# Patient Record
Sex: Male | Born: 1950 | Race: White | Hispanic: No | Marital: Married | Smoking: Former smoker
Health system: Southern US, Community
[De-identification: ages and names within clinical notes are randomized; demographics above are authoritative.]

## PROBLEM LIST (undated history)

## (undated) DIAGNOSIS — E119 Type 2 diabetes mellitus without complications: Secondary | ICD-10-CM

## (undated) DIAGNOSIS — I10 Essential (primary) hypertension: Secondary | ICD-10-CM

## (undated) DIAGNOSIS — G930 Cerebral cysts: Secondary | ICD-10-CM

## (undated) DIAGNOSIS — E785 Hyperlipidemia, unspecified: Secondary | ICD-10-CM

## (undated) HISTORY — DX: Type 2 diabetes mellitus without complications: E11.9

## (undated) HISTORY — DX: Essential (primary) hypertension: I10

## (undated) HISTORY — DX: Cerebral cysts: G93.0

## (undated) HISTORY — DX: Hyperlipidemia, unspecified: E78.5

---

## 1999-09-17 ENCOUNTER — Emergency Department (HOSPITAL_COMMUNITY): Admission: EM | Admit: 1999-09-17 | Discharge: 1999-09-17 | Payer: Self-pay | Admitting: *Deleted

## 2012-12-09 DIAGNOSIS — G930 Cerebral cysts: Secondary | ICD-10-CM

## 2012-12-09 HISTORY — DX: Cerebral cysts: G93.0

## 2012-12-26 ENCOUNTER — Telehealth: Payer: Self-pay

## 2012-12-26 ENCOUNTER — Ambulatory Visit (HOSPITAL_COMMUNITY)
Admission: RE | Admit: 2012-12-26 | Discharge: 2012-12-26 | Disposition: A | Discharge status: Home / Self Care | Payer: BC Managed Care – PPO | Source: Ambulatory Visit | Attending: Emergency Medicine | Admitting: Emergency Medicine

## 2012-12-26 ENCOUNTER — Ambulatory Visit (INDEPENDENT_AMBULATORY_CARE_PROVIDER_SITE_OTHER): Payer: BC Managed Care – PPO | Admitting: Emergency Medicine

## 2012-12-26 ENCOUNTER — Ambulatory Visit
Admission: RE | Admit: 2012-12-26 | Discharge: 2012-12-26 | Disposition: A | Discharge status: Home / Self Care | Payer: BC Managed Care – PPO | Source: Ambulatory Visit | Attending: Emergency Medicine | Admitting: Emergency Medicine

## 2012-12-26 ENCOUNTER — Other Ambulatory Visit: Payer: Self-pay | Admitting: Emergency Medicine

## 2012-12-26 VITALS — BP 170/102 | HR 83 | Temp 97.6°F | Resp 18 | Ht 71.0 in | Wt 229.0 lb

## 2012-12-26 DIAGNOSIS — R51 Headache: Secondary | ICD-10-CM

## 2012-12-26 DIAGNOSIS — I10 Essential (primary) hypertension: Secondary | ICD-10-CM

## 2012-12-26 DIAGNOSIS — G930 Cerebral cysts: Secondary | ICD-10-CM

## 2012-12-26 DIAGNOSIS — R930 Abnormal findings on diagnostic imaging of skull and head, not elsewhere classified: Secondary | ICD-10-CM

## 2012-12-26 DIAGNOSIS — R4182 Altered mental status, unspecified: Secondary | ICD-10-CM

## 2012-12-26 DIAGNOSIS — R42 Dizziness and giddiness: Secondary | ICD-10-CM

## 2012-12-26 LAB — POCT CBC
Granulocyte percent: 84.9 %G — AB (ref 37–80)
HCT, POC: 56.5 % — AB (ref 43.5–53.7)
Hemoglobin: 19.2 g/dL — AB (ref 14.1–18.1)
Lymph, poc: 1 (ref 0.6–3.4)
MCH, POC: 33.2 pg — AB (ref 27–31.2)
MCHC: 34 g/dL (ref 31.8–35.4)
MCV: 97.6 fL — AB (ref 80–97)
MID (cbc): 0.6 (ref 0–0.9)
MPV: 10 fL (ref 0–99.8)
POC Granulocyte: 9.3 — AB (ref 2–6.9)
POC LYMPH PERCENT: 9.4 %L — AB (ref 10–50)
POC MID %: 5.7 %M (ref 0–12)
Platelet Count, POC: 203 10*3/uL (ref 142–424)
RBC: 5.79 M/uL (ref 4.69–6.13)
RDW, POC: 12.8 %
WBC: 11 10*3/uL — AB (ref 4.6–10.2)

## 2012-12-26 LAB — LIPID PANEL
Cholesterol: 242 mg/dL — ABNORMAL HIGH (ref 0–200)
HDL: 47 mg/dL (ref >39)
LDL Cholesterol: 169 mg/dL — ABNORMAL HIGH (ref 0–99)
Total CHOL/HDL Ratio: 5.1 Ratio
Triglycerides: 128 mg/dL (ref <150)
VLDL: 26 mg/dL (ref 0–40)

## 2012-12-26 LAB — COMPREHENSIVE METABOLIC PANEL
ALT: 47 U/L (ref 0–53)
AST: 41 U/L — ABNORMAL HIGH (ref 0–37)
Albumin: 4.5 g/dL (ref 3.5–5.2)
Alkaline Phosphatase: 55 U/L (ref 39–117)
BUN: 9 mg/dL (ref 6–23)
CO2: 24 mEq/L (ref 19–32)
Calcium: 10.3 mg/dL (ref 8.4–10.5)
Chloride: 98 mEq/L (ref 96–112)
Creat: 1 mg/dL (ref 0.50–1.35)
Glucose, Bld: 265 mg/dL — ABNORMAL HIGH (ref 70–99)
Potassium: 4.1 mEq/L (ref 3.5–5.3)
Sodium: 133 mEq/L — ABNORMAL LOW (ref 135–145)
Total Bilirubin: 1 mg/dL (ref 0.3–1.2)
Total Protein: 8.1 g/dL (ref 6.0–8.3)

## 2012-12-26 LAB — POCT SEDIMENTATION RATE: POCT SED RATE: 4 mm/hr (ref 0–22)

## 2012-12-26 MED ORDER — GADOBENATE DIMEGLUMINE 529 MG/ML IV SOLN
20.0000 mL | Freq: Once | INTRAVENOUS | Status: AC | PRN
  Administered 2012-12-26: 20 mL via INTRAVENOUS

## 2012-12-26 MED ORDER — HYDROCODONE-ACETAMINOPHEN 5-325 MG PO TABS: 1.0000 | ORAL_TABLET | Freq: Four times a day (QID) | ORAL | Status: DC | PRN

## 2012-12-26 MED ORDER — LISINOPRIL 20 MG PO TABS: 20.0000 mg | ORAL_TABLET | Freq: Every day | ORAL | Status: DC

## 2012-12-26 NOTE — Telephone Encounter (Signed)
Will send in a few Norco, however he should attempt to avoid taking these and only take them if he must. Taking a controlled medication for headache can mask symptoms or exacerbate the symptoms.

## 2012-12-26 NOTE — Progress Notes (Signed)
[facility] [address] [place] [address] [phone]  Date:  12/26/2012   Name:  Darren Gutierrez   DOB:  28-May-1950   MRN:  [id number]  PCP:  No PCP Per Patient    Chief Complaint: pain on left side of head and Emesis   History of Present Illness:  Darren Gutierrez is a 62 y.o. 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  Has a left temporal headache for the past week.  No history of injury or antecedent illness. History of untreated hypertension.  Reformed 3 pack a day smoker.  Unsteady on his feet at times. No visual symptoms.  Says has left sided weakness at times.  Says he is pain free now but the headache is quite severe when it comes.  His daughter is the translator and says he is very stubborn and not a complainer so the fact that he is here is indication of how severe his complaint is.  No improvement with over the counter medications or other home remedies. Denies other complaint or health concern today.   There are no active problems to display for this patient.   History reviewed. No pertinent past medical history.  History reviewed. No pertinent past surgical history.  History  Substance Use Topics  . Smoking status: Never Smoker   . Smokeless tobacco: Not on file  . Alcohol Use: Yes    History reviewed. No pertinent family history.  No Known Allergies  Medication list has been reviewed and updated.  No current outpatient prescriptions on file prior to visit.   No current facility-administered medications on file prior to visit.    Review of Systems:  As per HPI, otherwise negative.    Physical Examination: Filed Vitals:   12/26/12 1024  BP: 170/102  Pulse: 83  Temp: 97.6 F (36.4 C)  Resp: 18   Filed Vitals:   12/26/12 1024  Height: 5\' 11"  (1.803 m)  Weight: 229 lb (103.874 kg)   Body mass index is 31.95 kg/(m^2). Ideal Body Weight: Weight in (lb) to  have BMI = 25: 178.9  GEN: WDWN, NAD, Non-toxic, A & O x 3 HEENT: Atraumatic, Normocephalic. Neck supple. No masses, No LAD. Ears and Nose: No external deformity. CV: RRR, No M/G/R. No JVD. No thrill. No extra heart sounds. PULM: CTA B, no wheezes, crackles, rhonchi. No retractions. No resp. distress. No accessory muscle use. ABD: S, NT, ND, +BS. No rebound. No HSM. EXTR: No c/c/e NEURO ataxic gait with impaired tandem gait. PRRERLA EOMI CN 2-12 intact.  Fundi benign PSYCH: Normally interactive. Conversant. Not depressed or anxious appearing.  Calm demeanor.    Assessment and Plan:  Ataxia subarachnoid cyst Labs CT Hypertension Lisinopril  Signed,  Phillips Odor, MD   CT reviewed and discussed with Dr Wynetta Emery.  He asked for a MRI and will see the patient this week in his office.

## 2012-12-26 NOTE — Telephone Encounter (Signed)
Faxed in rx for Norco.  Spoke with Alma Downs (daughter) and gave precautions regarding her father taking Norco.   She states she understands

## 2012-12-26 NOTE — Telephone Encounter (Signed)
Spoke with patient's daughter, gave results of MRI (after discussing with Dr Dareen Piano): MRI showed nothing acute, no cancer, stroke, hemorrhage--->positive for long standing cyst, patient will see neurosurgeon this week regarding cyst. In the meantime, patient is in pain and is taking tylenol.  Can he try anything stronger until appt with neuro?

## 2012-12-26 NOTE — Telephone Encounter (Signed)
Pts daughter Mickie Bail would like for someone to call her regarding pts MRI report, he is in pain and would like some pain medication. Best#[phone]

## 2012-12-27 ENCOUNTER — Ambulatory Visit (INDEPENDENT_AMBULATORY_CARE_PROVIDER_SITE_OTHER): Payer: BC Managed Care – PPO | Admitting: Emergency Medicine

## 2012-12-27 VITALS — BP 120/82 | HR 68 | Temp 98.4°F | Resp 16 | Ht 71.5 in | Wt 225.4 lb

## 2012-12-27 DIAGNOSIS — R7309 Other abnormal glucose: Secondary | ICD-10-CM

## 2012-12-27 DIAGNOSIS — E782 Mixed hyperlipidemia: Secondary | ICD-10-CM

## 2012-12-27 DIAGNOSIS — R51 Headache: Secondary | ICD-10-CM

## 2012-12-27 DIAGNOSIS — E119 Type 2 diabetes mellitus without complications: Secondary | ICD-10-CM

## 2012-12-27 LAB — POCT GLYCOSYLATED HEMOGLOBIN (HGB A1C): Hemoglobin A1C: 8.2

## 2012-12-27 MED ORDER — ROSUVASTATIN CALCIUM 20 MG PO TABS: 20.0000 mg | ORAL_TABLET | Freq: Every day | ORAL | Status: DC

## 2012-12-27 MED ORDER — HYDROCODONE-ACETAMINOPHEN 5-325 MG PO TABS: 1.0000 | ORAL_TABLET | ORAL | Status: DC | PRN

## 2012-12-27 MED ORDER — METFORMIN HCL ER 500 MG PO TB24: ORAL_TABLET | ORAL | Status: DC

## 2012-12-27 NOTE — Patient Instructions (Addendum)
Type 2 Diabetes Mellitus, Adult Type 2 diabetes mellitus, often simply referred to as type 2 diabetes, is a long-lasting (chronic) disease. In type 2 diabetes, the pancreas does not make enough insulin (a hormone), the cells are less responsive to the insulin that is made (insulin resistance), or both. Normally, insulin moves sugars from food into the tissue cells. The tissue cells use the sugars for energy. The lack of insulin or the lack of normal response to insulin causes excess sugars to build up in the blood instead of going into the tissue cells. As a result, high blood sugar (hyperglycemia) develops. The effect of high sugar (glucose) levels can cause many complications. Type 2 diabetes was also previously called adult-onset diabetes but it can occur at any age.  RISK FACTORS  A person is predisposed to developing type 2 diabetes if someone in the family has the disease and also has one or more of the following primary risk factors:  Overweight.  An inactive lifestyle.  A history of consistently eating high-calorie foods. Maintaining a normal weight and regular physical activity can reduce the chance of developing type 2 diabetes. SYMPTOMS  A person with type 2 diabetes may not show symptoms initially. The symptoms of type 2 diabetes appear slowly. The symptoms include:  Increased thirst (polydipsia).  Increased urination (polyuria).  Increased urination during the night (nocturia).  Weight loss. This weight loss may be rapid.  Frequent, recurring infections.  Tiredness (fatigue).  Weakness.  Vision changes, such as blurred vision.  Fruity smell to your breath.  Abdominal pain.  Nausea or vomiting.  Cuts or bruises which are slow to heal.  Tingling or numbness in the hands or feet. DIAGNOSIS Type 2 diabetes is frequently not diagnosed until complications of diabetes are present. Type 2 diabetes is diagnosed when symptoms or complications are present and when blood  glucose levels are increased. Your blood glucose level may be checked by one or more of the following blood tests:  A fasting blood glucose test. You will not be allowed to eat for at least 8 hours before a blood sample is taken.  A random blood glucose test. Your blood glucose is checked at any time of the day regardless of when you ate.  A hemoglobin A1c blood glucose test. A hemoglobin A1c test provides information about blood glucose control over the previous 3 months.  An oral glucose tolerance test (OGTT). Your blood glucose is measured after you have not eaten (fasted) for 2 hours and then after you drink a glucose-containing beverage. TREATMENT   You may need to take insulin or diabetes medicine daily to keep blood glucose levels in the desired range.  You will need to match insulin dosing with exercise and healthy food choices. The treatment goal is to maintain the before meal blood sugar (preprandial glucose) level at 70 130 mg/dL. HOME CARE INSTRUCTIONS   Have your hemoglobin A1c level checked twice a year.  Perform daily blood glucose monitoring as directed by your caregiver.  Monitor urine ketones when you are ill and as directed by your caregiver.  Take your diabetes medicine or insulin as directed by your caregiver to maintain your blood glucose levels in the desired range.  Never run out of diabetes medicine or insulin. It is needed every day.  Adjust insulin based on your intake of carbohydrates. Carbohydrates can raise blood glucose levels but need to be included in your diet. Carbohydrates provide vitamins, minerals, and fiber which are an essential part of   a healthy diet. Carbohydrates are found in fruits, vegetables, whole grains, dairy products, legumes, and foods containing added sugars.    Eat healthy foods. Alternate 3 meals with 3 snacks.  Lose weight if overweight.  Carry a medical alert card or wear your medical alert jewelry.  Carry a 15 gram  carbohydrate snack with you at all times to treat low blood glucose (hypoglycemia). Some examples of 15 gram carbohydrate snacks include:  Glucose tablets, 3 or 4   Glucose gel, 15 gram tube  Raisins, 2 tablespoons (24 grams)  Jelly beans, 6  Animal crackers, 8  Regular pop, 4 ounces (120 mL)  Gummy treats, 9  Recognize hypoglycemia. Hypoglycemia occurs with blood glucose levels of 70 mg/dL and below. The risk for hypoglycemia increases when fasting or skipping meals, during or after intense exercise, and during sleep. Hypoglycemia symptoms can include:  Tremors or shakes.  Decreased ability to concentrate.  Sweating.  Increased heart rate.  Headache.  Dry mouth.  Hunger.  Irritability.  Anxiety.  Restless sleep.  Altered speech or coordination.  Confusion.  Treat hypoglycemia promptly. If you are alert and able to safely swallow, follow the 15:15 rule:  Take 15 20 grams of rapid-acting glucose or carbohydrate. Rapid-acting options include glucose gel, glucose tablets, or 4 ounces (120 mL) of fruit juice, regular soda, or low fat milk.  Check your blood glucose level 15 minutes after taking the glucose.  Take 15 20 grams more of glucose if the repeat blood glucose level is still 70 mg/dL or below.  Eat a meal or snack within 1 hour once blood glucose levels return to normal.    Be alert to polyuria and polydipsia which are early signs of hyperglycemia. An early awareness of hyperglycemia allows for prompt treatment. Treat hyperglycemia as directed by your caregiver.  Engage in at least 150 minutes of moderate-intensity physical activity a week, spread over at least 3 days of the week or as directed by your caregiver. In addition, you should engage in resistance exercise at least 2 times a week or as directed by your caregiver.  Adjust your medicine and food intake as needed if you start a new exercise or sport.  Follow your sick day plan at any time you  are unable to eat or drink as usual.  Avoid tobacco use.  Limit alcohol intake to no more than 1 drink per day for nonpregnant women and 2 drinks per day for men. You should drink alcohol only when you are also eating food. Talk with your caregiver whether alcohol is safe for you. Tell your caregiver if you drink alcohol several times a week.  Follow up with your caregiver regularly.  Schedule an eye exam soon after the diagnosis of type 2 diabetes and then annually.  Perform daily skin and foot care. Examine your skin and feet daily for cuts, bruises, redness, nail problems, bleeding, blisters, or sores. A foot exam by a caregiver should be done annually.  Brush your teeth and gums at least twice a day and floss at least once a day. Follow up with your dentist regularly.  Share your diabetes management plan with your workplace or school.  Stay up-to-date with immunizations.  Learn to manage stress.  Obtain ongoing diabetes education and support as needed.  Participate in, or seek rehabilitation as needed to maintain or improve independence and quality of life. Request a physical or occupational therapy referral if you are having foot or hand numbness or difficulties with grooming,   dressing, eating, or physical activity. SEEK MEDICAL CARE IF:   You are unable to eat food or drink fluids for more than 6 hours.  You have nausea and vomiting for more than 6 hours.  Your blood glucose level is over 240 mg/dL.  There is a change in mental status.  You develop an additional serious illness.  You have diarrhea for more than 6 hours.  You have been sick or have had a fever for a couple of days and are not getting better.  You have pain during any physical activity.  SEEK IMMEDIATE MEDICAL CARE IF:  You have difficulty breathing.  You have moderate to large ketone levels. MAKE SURE YOU:  Understand these instructions.  Will watch your condition.  Will get help right away if  you are not doing well or get worse. Document Released: 04/27/2005 Document Revised: 01/20/2012 Document Reviewed: 11/24/2011 [facility] Patient Information 2014 [place], [place]. Blood Sugar Monitoring, Adult GLUCOSE METERS FOR SELF-MONITORING OF BLOOD GLUCOSE  It is important to be able to correctly measure your blood sugar (glucose). You can use a blood glucose monitor (a small battery-operated device) to check your glucose level at any time. This allows you and your caregiver to monitor your diabetes and to determine how well your treatment plan is working. The process of monitoring your blood glucose with a glucose meter is called self-monitoring of blood glucose (SMBG). When people with diabetes control their blood sugar, they have better health. To test for glucose with a typical glucose meter, place the disposable strip in the meter. Then place a small sample of blood on the "test strip." The test strip is coated with chemicals that combine with glucose in blood. The meter measures how much glucose is present. The meter displays the glucose level as a number. Several new models can record and store a number of test results. Some models can connect to personal computers to store test results or print them out.  Newer meters are often easier to use than older models. Some meters allow you to get blood from places other than your fingertip. Some new models have automatic timing, error codes, signals, or barcode readers to help with proper adjustment (calibration). Some meters have a large display screen or spoken instructions for people with visual impairments.  INSTRUCTIONS FOR USING GLUCOSE METERS  Wash your hands with soap and warm water, or clean the area with alcohol. Dry your hands completely.  Prick the side of your fingertip with a lancet (a sharp-pointed tool used by hand).  Hold the hand down and gently milk the finger until a small drop of blood appears. Catch the blood with the test  strip.  Follow the instructions for inserting the test strip and using the SMBG meter. Most meters require the meter to be turned on and the test strip to be inserted before applying the blood sample.  Record the test result.  Read the instructions carefully for both the meter and the test strips that go with it. Meter instructions are found in the user manual. Keep this manual to help you solve any problems that may arise. Many meters use "error codes" when there is a problem with the meter, the test strip, or the blood sample on the strip. You will need the manual to understand these error codes and fix the problem.  New devices are available such as laser lancets and meters that can test blood taken from "alternative sites" of the body, other than fingertips. However,  you should use standard fingertip testing if your glucose changes rapidly. Also, use standard testing if:  You have eaten, exercised, or taken insulin in the past 2 hours.  You think your glucose is low.  You tend to not feel symptoms of low blood glucose (hypoglycemia).  You are ill or under stress.  Clean the meter as directed by the manufacturer.  Test the meter for accuracy as directed by the manufacturer.  Take your meter with you to your caregiver's office. This way, you can test your glucose in front of your caregiver to make sure you are using the meter correctly. Your caregiver can also take a sample of blood to test using a routine lab method. If values on the glucose meter are close to the lab results, you and your caregiver will see that your meter is working well and you are using good technique. Your caregiver will advise you about what to do if the results do not match. FREQUENCY OF TESTING  Your caregiver will tell you how often you should check your blood glucose. This will depend on your type of diabetes, your current level of diabetes control, and your types of medicines. The following are general  guidelines, but your care plan may be different. Record all your readings and the time of day you took them for review with your caregiver.   Diabetes type 1.  When you are using insulin with good diabetic control (either multiple daily injections or via a pump), you should check your glucose 4 times a day.  If your diabetes is not well controlled, you may need to monitor more frequently, including before meals and 2 hours after meals, at bedtime, and occasionally between 2 a.m. and 3 a.m.  You should always check your glucose before a dose of insulin or before changing the rate on your insulin pump.  Diabetes type 2.  Guidelines for SMBG in diabetes type 2 are not as well defined.  If you are on insulin, follow the guidelines above.  If you are on medicines, but not insulin, and your glucose is not well controlled, you should test at least twice daily.  If you are not on insulin, and your diabetes is controlled with medicines or diet alone, you should test at least once daily, usually before breakfast.  A weekly profile will help your caregiver advise you on your care plan. The week before your visit, check your glucose before a meal and 2 hours after a meal at least daily. You may want to test before and after a different meal each day so you and your caregiver can tell how well controlled your blood sugars are throughout the course of a 24 hour period.  Gestational diabetes (diabetes during pregnancy).  Frequent testing is often necessary. Accurate timing is important.  If you are not on insulin, check your glucose 4 times a day. Check it before breakfast and 1 hour after the start of each meal.  If you are on insulin, check your glucose 6 times a day. Check it before each meal and 1 hour after the first bite of each meal.  General guidelines.  More frequent testing is required at the start of insulin treatment. Your caregiver will instruct you.  Test your glucose any time you  suspect you have low blood sugar (hypoglycemia).  You should test more often when you change medicines, when you have unusual stress or illness, or in other unusual circumstances. OTHER THINGS TO KNOW ABOUT GLUCOSE METERS  Measurement Range. Most glucose meters are able to read glucose levels over a broad range of values from as low as 0 to as high as 600 mg/dL. If you get an extremely high or low reading from your meter, you should first confirm it with another reading. Report very high or very low readings to your caregiver.  Whole Blood Glucose versus Plasma Glucose. Some older home glucose meters measure glucose in your whole blood. In a lab or when using some newer home glucose meters, the glucose is measured in your plasma (one component of blood). The difference can be important. It is important for you and your caregiver to know whether your meter gives its results as "whole blood equivalent" or "plasma equivalent."  Display of High and Low Glucose Values. Part of learning how to operate a meter is understanding what the meter results mean. Know how high and low glucose concentrations are displayed on your meter.  Factors that Affect Glucose Meter Performance. The accuracy of your test results depends on many factors and varies depending on the brand and type of meter. These factors include:  Low red blood cell count (anemia).  Substances in your blood (such as uric acid, vitamin C, and others).  Environmental factors (temperature, humidity, altitude).  Name-brand versus generic test strips.  Calibration. Make sure your meter is set up properly. It is a good idea to do a calibration test with a control solution recommended by the manufacturer of your meter whenever you begin using a fresh bottle of test strips. This will help verify the accuracy of your meter.  Improperly stored, expired, or defective test strips. Keep your strips in a dry place with the lid on.  Soiled  meter.  Inadequate blood sample. NEW TECHNOLOGIES FOR GLUCOSE TESTING Alternative site testing Some glucose meters allow testing blood from alternative sites. These include the:  Upper arm.  Forearm.  Base of the thumb.  Thigh. Sampling blood from alternative sites may be desirable. However, it may have some limitations. Blood in the fingertips show changes in glucose levels more quickly than blood in other parts of the body. This means that alternative site test results may be different from fingertip test results, not because of the meter's ability to test accurately, but because the actual glucose concentration can be different.  Continuous Glucose Monitoring Devices to measure your blood glucose continuously are available, and others are in development. These methods can be more expensive than self-monitoring with a glucose meter. However, it is uncertain how effective and reliable these devices are. Your caregiver will advise you if this approach makes sense for you. IF BLOOD SUGARS ARE CONTROLLED, PEOPLE WITH DIABETES REMAIN HEALTHIER.  SMBG is an important part of the treatment plan of patients with diabetes mellitus. Below are reasons for using SMBG:   It confirms that your glucose is at a specific, healthy level.  It detects hypoglycemia and severe hyperglycemia.  It allows you and your caregiver to make adjustments in response to changes in lifestyle for individuals requiring medicine.  It determines the need for starting insulin therapy in temporary diabetes that happens during pregnancy (gestational diabetes). Document Released: 04/30/2003 Document Revised: 07/20/2011 Document Reviewed: 08/21/2010 [facility] Patient Information 2014 [place], [place]. Diabetes and Small Vessel Disease Small vessel disease (microvascular disease) includes nephropathy, retinopathy, and neuropathy. People with diabetes are at risk for these problems, but keeping blood glucose (sugar) controlled is  helpful in preventing problems. DIABETIC KIDNEY PROBLEMS (DIABETIC NEPHROPATHY)  Diabetic nephropathy occurs in  many patients with diabetes.  Damage to the small vessels in the kidneys is the leading cause of end-stage renal disease (ESRD).  Protein in the urine (albuminuria) in the range of 30 to 300 mg/24 h (microalbuminuria) is a sign of the earliest stage of diabetic nephropathy.  Good blood glucose (sugar) and blood pressure control significantly reduce the progression of nephropathy. DIABETIC EYE PROBLEMS (DIABETIC RETINOPATHY)  Diabetic retinopathy is the most common cause of new cases of blindness in adults. It is related to the number of years you have had diabetes.  Common risk factors include high blood sugar (hyperglycemia), high blood pressure (hypertension), and poorly controlled blood lipids such as high blood cholesterol (hypercholesterolemia). DIABETIC NERVE PROBLEMS (DIABETIC NEUROPATHY) Diabetic neuropathy is the most common, long-term complication of diabetes. It is responsible for more than half of leg amputations not due to accidents. The main risk for developing diabetic neuropathy seems to be uncontrolled blood sugars. Hyperglycemia damages the nerve fibers causing sensation (feeling) problems. The closer you can keep the following guidelines, the better chance you will have avoiding problems from small vessel disease.  Working toward near normal blood glucose or as normal as possible. You will need to keep your blood glucose and A1c at the target range prescribed by your caregiver.  Keep your blood pressure less than 120/80.  Keep your low-density lipoprotein (LDL) cholesterol (one of the fats in your blood) at less than 100 mg/dL. An LDL less than 70 mg/dL may be recommended for high risk patients. You cannot change your family history, but it is important to change the risk factors that you can. Risk factors you can control include:  Controlling high blood  pressure.  Stopping smoking.  Using alcohol only in moderation. Generally, this means about one drink per day for women and two drinks per day for men.  Controlling your blood lipids (cholesterol and triglycerides).  Treating heart problems, if these are contributing to risk. SEEK MEDICAL CARE IF:   You are having problems keeping your blood glucose in goal range.  You notice a change in your vision or new problems with your vision.  You have wound or sore that does not heal.  Your blood pressure is above the target range. Document Released: 04/30/2003 Document Revised: 04/13/2012 Document Reviewed: 10/05/2008 [facility] Patient Information 2014 [place], [place]. Diabetes and Exercise Regular exercise is important and can help:   Control blood glucose (sugar).  Decrease blood pressure.    Control blood lipids (cholesterol, triglycerides).  Improve overall health. BENEFITS FROM EXERCISE  Improved fitness.  Improved flexibility.  Improved endurance.  Increased bone density.  Weight control.  Increased muscle strength.  Decreased body fat.  Improvement of the body's use of insulin, a hormone.  Increased insulin sensitivity.  Reduction of insulin needs.  Reduced stress and tension.  Helps you feel better. People with diabetes who add exercise to their lifestyle gain additional benefits, including:  Weight loss.  Reduced appetite.  Improvement of the body's use of blood glucose.  Decreased risk factors for heart disease:  Lowering of cholesterol and triglycerides.  Raising the level of good cholesterol (high-density lipoproteins, HDL).  Lowering blood sugar.  Decreased blood pressure. TYPE 1 DIABETES AND EXERCISE  Exercise will usually lower your blood glucose.  If blood glucose is greater than 240 mg/dl, check urine ketones. If ketones are present, do not exercise.  Location of the insulin injection sites may need to be adjusted with exercise.  Avoid injecting insulin into areas of the  body that will be exercised. For example, avoid injecting insulin into:  The arms when playing tennis.  The legs when jogging. For more information, discuss this with your caregiver.  Keep a record of:  Food intake.  Type and amount of exercise.  Expected peak times of insulin action.  Blood glucose levels. Do this before, during, and after exercise. Review your records with your caregiver. This will help you to develop guidelines for adjusting food intake and insulin amounts.  TYPE 2 DIABETES AND EXERCISE  Regular physical activity can help control blood glucose.  Exercise is important because it may:  Increase the body's sensitivity to insulin.  Improve blood glucose control.  Exercise reduces the risk of heart disease. It decreases serum cholesterol and triglycerides. It also lowers blood pressure.  Those who take insulin or oral hypoglycemic agents should watch for signs of hypoglycemia. These signs include dizziness, shaking, sweating, chills, and confusion.  Body water is lost during exercise. It must be replaced. This will help to avoid loss of body fluids (dehydration) or heat stroke. Be sure to talk to your caregiver before starting an exercise program to make sure it is safe for you. Remember, any activity is better than none.  Document Released: 07/18/2003 Document Revised: 07/20/2011 Document Reviewed: 11/01/2008 [facility] Patient Information 2014 [place], [place].

## 2012-12-27 NOTE — Progress Notes (Signed)
[facility] [address], [place] [address] [phone]  Date:  12/27/2012   Name:  Darren Gutierrez   DOB:  [birth year]   MRN:  [id number]  PCP:  No PCP Per Patient    Chief Complaint: Follow-up   History of Present Illness:  Darren Gutierrez is a 62 y.o. very pleasant male patient who presents with the following:  Seen yesterday with neuro symptoms and sent for CT and MR with a large subarachnoid cyst.  His headache is worse today and his family says he can only walk with difficulty. Of note, his sugar and lipids were also elevated.  He has not started his lisinopril but his blood pressure has returned to normal.  No improvement with over the counter medications or other home remedies. Denies other complaint or health concern today.   There are no active problems to display for this patient.   No past medical history on file.  No past surgical history on file.  History  Substance Use Topics  . Smoking status: Never Smoker   . Smokeless tobacco: Not on file  . Alcohol Use: Yes    No family history on file.  No Known Allergies  Medication list has been reviewed and updated.  Current Outpatient Prescriptions on File Prior to Visit  Medication Sig Dispense Refill  . HYDROcodone-acetaminophen (NORCO/VICODIN) 5-325 MG per tablet Take 1 tablet by mouth every 6 (six) hours as needed for pain.  15 tablet  0  . lisinopril (PRINIVIL,ZESTRIL) 20 MG tablet Take 1 tablet (20 mg total) by mouth daily.  90 tablet  3   No current facility-administered medications on file prior to visit.    Review of Systems:  As per HPI, otherwise negative.    Physical Examination: Filed Vitals:   12/27/12 1257  BP: 120/82  Pulse: 68  Temp: 98.4 F (36.9 C)  Resp: 16   Filed Vitals:   12/27/12 1257  Height: 5' 11.5" (1.816 m)  Weight: 225 lb 6.4 oz (102.241 kg)   Body mass index is 31 kg/(m^2). Ideal Body Weight: Weight in (lb) to have BMI = 25:  181.4   GEN: WDWN, NAD, Non-toxic, Alert & Oriented x 3 HEENT: Atraumatic, Normocephalic.  Ears and Nose: No external deformity. EXTR: No clubbing/cyanosis/edema NEURO: ataxic gait.  PSYCH: Normally interactive. Conversant. Not depressed or anxious appearing.  Calm demeanor.    Assessment and Plan: New onset NIDDM Hyperlipidemia Subarachnoid cyst awaiting neurosurgery appt with Dr Wynetta Emery  Signed,  Phillips Odor, MD

## 2012-12-29 ENCOUNTER — Telehealth: Payer: Self-pay

## 2012-12-29 NOTE — Telephone Encounter (Signed)
Pharm notified that Crestor needs a prior auth which will not be approved if pt has not tried/failed generic alternatives. Can we change this med to a generic?

## 2012-12-29 NOTE — Telephone Encounter (Signed)
No, I explained to patient it is preferred.

## 2013-01-02 MED ORDER — ATORVASTATIN CALCIUM 40 MG PO TABS: 40.0000 mg | ORAL_TABLET | Freq: Every day | ORAL | Status: DC

## 2013-01-02 NOTE — Telephone Encounter (Signed)
Discussed with Dr Dareen Piano that we don't have any record that pt has ever tried a generic or any other med for chol other than Crestor. PA will not be approved. Dr Dareen Piano agreed to send in a Rx for Lipitor 40 mg QD.  Notified daughter that Lipitor was sent in.

## 2013-07-26 ENCOUNTER — Other Ambulatory Visit: Payer: Self-pay | Admitting: Emergency Medicine

## 2013-08-19 ENCOUNTER — Ambulatory Visit: Payer: BC Managed Care – PPO | Admitting: Family Medicine

## 2013-08-19 VITALS — BP 130/80 | HR 72 | Temp 98.7°F | Resp 16 | Ht 71.0 in | Wt 236.6 lb

## 2013-08-19 DIAGNOSIS — E7800 Pure hypercholesterolemia, unspecified: Secondary | ICD-10-CM

## 2013-08-19 DIAGNOSIS — E1165 Type 2 diabetes mellitus with hyperglycemia: Secondary | ICD-10-CM

## 2013-08-19 DIAGNOSIS — R109 Unspecified abdominal pain: Secondary | ICD-10-CM

## 2013-08-19 DIAGNOSIS — E119 Type 2 diabetes mellitus without complications: Secondary | ICD-10-CM | POA: Insufficient documentation

## 2013-08-19 DIAGNOSIS — I10 Essential (primary) hypertension: Secondary | ICD-10-CM

## 2013-08-19 DIAGNOSIS — IMO0001 Reserved for inherently not codable concepts without codable children: Secondary | ICD-10-CM

## 2013-08-19 DIAGNOSIS — G930 Cerebral cysts: Secondary | ICD-10-CM | POA: Insufficient documentation

## 2013-08-19 DIAGNOSIS — R51 Headache: Secondary | ICD-10-CM

## 2013-08-19 LAB — POCT CBC
Granulocyte percent: 73.3 %G (ref 37–80)
HCT, POC: 47.7 % (ref 43.5–53.7)
Hemoglobin: 15.6 g/dL (ref 14.1–18.1)
Lymph, poc: 2.4 (ref 0.6–3.4)
MCH, POC: 31.3 pg — AB (ref 27–31.2)
MCHC: 32.7 g/dL (ref 31.8–35.4)
MCV: 95.6 fL (ref 80–97)
MID (cbc): 1.2 — AB (ref 0–0.9)
MPV: 9 fL (ref 0–99.8)
POC Granulocyte: 9.9 — AB (ref 2–6.9)
POC LYMPH PERCENT: 17.5 %L (ref 10–50)
POC MID %: 9.2 %M (ref 0–12)
Platelet Count, POC: 185 10*3/uL (ref 142–424)
RBC: 4.99 M/uL (ref 4.69–6.13)
RDW, POC: 14.1 %
WBC: 13.5 10*3/uL — AB (ref 4.6–10.2)

## 2013-08-19 LAB — POCT UA - MICROSCOPIC ONLY
Casts, Ur, LPF, POC: NEGATIVE
Crystals, Ur, HPF, POC: NEGATIVE
Mucus, UA: POSITIVE
RBC, urine, microscopic: NEGATIVE
Yeast, UA: NEGATIVE

## 2013-08-19 LAB — COMPREHENSIVE METABOLIC PANEL
ALT: 23 U/L (ref 0–53)
AST: 17 U/L (ref 0–37)
Albumin: 4.3 g/dL (ref 3.5–5.2)
Alkaline Phosphatase: 52 U/L (ref 39–117)
BUN: 13 mg/dL (ref 6–23)
CO2: 24 mEq/L (ref 19–32)
Calcium: 9.7 mg/dL (ref 8.4–10.5)
Chloride: 98 mEq/L (ref 96–112)
Creat: 1.13 mg/dL (ref 0.50–1.35)
Glucose, Bld: 124 mg/dL — ABNORMAL HIGH (ref 70–99)
Potassium: 4.1 mEq/L (ref 3.5–5.3)
Sodium: 134 mEq/L — ABNORMAL LOW (ref 135–145)
Total Bilirubin: 1 mg/dL (ref 0.2–1.2)
Total Protein: 7.7 g/dL (ref 6.0–8.3)

## 2013-08-19 LAB — LIPID PANEL
Cholesterol: 203 mg/dL — ABNORMAL HIGH (ref 0–200)
HDL: 41 mg/dL (ref >39)
LDL Cholesterol: 142 mg/dL — ABNORMAL HIGH (ref 0–99)
Total CHOL/HDL Ratio: 5 Ratio
Triglycerides: 102 mg/dL (ref <150)
VLDL: 20 mg/dL (ref 0–40)

## 2013-08-19 LAB — GLUCOSE, POCT (MANUAL RESULT ENTRY): POC Glucose: 128 mg/dl — AB (ref 70–99)

## 2013-08-19 LAB — POCT URINALYSIS DIPSTICK
Blood, UA: NEGATIVE
Glucose, UA: 100
Ketones, UA: 15
Leukocytes, UA: NEGATIVE
Nitrite, UA: NEGATIVE
Protein, UA: 30
Spec Grav, UA: >=1.03
Urobilinogen, UA: 0.2
pH, UA: 5.5

## 2013-08-19 LAB — TSH: TSH: 1.489 u[IU]/mL (ref 0.350–4.500)

## 2013-08-19 LAB — MICROALBUMIN, URINE: Microalb, Ur: 1.17 mg/dL (ref 0.00–1.89)

## 2013-08-19 LAB — POCT GLYCOSYLATED HEMOGLOBIN (HGB A1C): Hemoglobin A1C: 5.8

## 2013-08-19 MED ORDER — ROSUVASTATIN CALCIUM 20 MG PO TABS: 20.0000 mg | ORAL_TABLET | Freq: Every day | ORAL | Status: DC

## 2013-08-19 MED ORDER — ATORVASTATIN CALCIUM 40 MG PO TABS: 40.0000 mg | ORAL_TABLET | Freq: Every day | ORAL | Status: DC

## 2013-08-19 MED ORDER — LISINOPRIL 20 MG PO TABS: 20.0000 mg | ORAL_TABLET | Freq: Every day | ORAL | Status: DC

## 2013-08-19 MED ORDER — METFORMIN HCL ER 500 MG PO TB24: ORAL_TABLET | ORAL | Status: DC

## 2013-08-19 MED ORDER — ONDANSETRON 8 MG PO TBDP: 8.0000 mg | ORAL_TABLET | Freq: Three times a day (TID) | ORAL | Status: DC | PRN

## 2013-08-19 NOTE — Patient Instructions (Signed)

## 2013-08-19 NOTE — Progress Notes (Addendum)
Subjective:    Patient ID: Darren Gutierrez, male    DOB: 05/30/1950, 63 y.o.   MRN: [id number]  Headache  Associated symptoms include abdominal pain, coughing and nausea. Pertinent negatives include no dizziness, ear pain, fever, numbness, photophobia, sore throat, vomiting or weakness.  Abdominal Pain Associated symptoms include constipation, headaches and nausea. Pertinent negatives include no diarrhea, dysuria, fever, frequency or vomiting.   Chief Complaint  Patient presents with  . Headache  . Abdominal Pain   This chart was scribed for Darren ChickKristi M Orah Sonnen, MD by Andrew Auaven Small, ED Scribe. This patient was seen in room 9 and the patient's care was started at 12:37 PM.  HPI Comments: Darren Gutierrez is a 63 y.o. male who presents with adult daughter [facility] the [facility] complaining of a moderate left sided HA onset 2 days ago. Pt reports mild blurred vision, nausea, fever, chills,. Pt reports that has not been able to eat for the past 2 days. Pt denies taking any OTC medication.  Pt denies dizziness, numbness, tingling, leg weakness, trouble swallowing.  Pt is also complaining of upper abdominal pain. Pt reports associated constipation and cough. Pt denies taking medication. Pt denies eating today. Pt denies diarrhea, sore throat, ear pain, stuffy nose, dysuria, black or bloody stool. and urinary frequency. Pt reports abdominal pain is worse than HA.  Abdominal pain much better today; eating made nausea worse yesterday.  Nausea is better today.  HA is better today.  Pt reports blood pressure was 200 1 day ago but went down 180-150.   Pt reports his mother and father has passed away in their 4480's and that they both had HTN.   Pt reports children. Pt moved to [place] in [place] and does not speak english. Pt reports he is a former smoker and quit in 2002. He reports that he does drink.  Pt was seen in August 2014 by Dr. Dareen PianoAnderson for HA. Pt was sent for CT and MRI which revealed a  subarachnoid cyst and was referred to neurosurgeon. Pt was seen by Dr. Wynetta Emeryram 12/28/12. Congenital cyst was found but Dr. Wynetta Emeryram was not convinced this was source of HA. Surgical resection was not warranted at that time.   During that visit pt was diagnosed with DM, HTN and high cholesterol; no follow-up since that time. Wife is diabetic and checks patient's sugars; running 80-120s.  Compliance with Metformin two daily; needs refill. Needs refill on Crestor as well.  Compliance with BP medication; blood pressure running normal at home.  History reviewed. No pertinent past medical history. No Known Allergies Prior to Admission medications   Medication Sig Start Date End Date Taking? Authorizing Provider  atorvastatin (LIPITOR) 40 MG tablet Take 1 tablet (40 mg total) by mouth daily. PATIENT NEEDS OFFICE VISIT FOR ADDITIONAL REFILLS 07/26/13   Phillips OdorJeffery Anderson, MD  HYDROcodone-acetaminophen (NORCO/VICODIN) 5-325 MG per tablet Take 1-2 tablets by mouth every 4 (four) hours as needed for pain. 12/27/12   Phillips OdorJeffery Anderson, MD  lisinopril (PRINIVIL,ZESTRIL) 20 MG tablet Take 1 tablet (20 mg total) by mouth daily. 12/26/12   Phillips OdorJeffery Anderson, MD  metFORMIN (GLUCOPHAGE XR) 500 MG 24 hr tablet 1 with evening meal for 7 days, 2 for a week then 3 for a week then 4 daily with evening meal. 12/27/12   Phillips OdorJeffery Anderson, MD  rosuvastatin (CRESTOR) 20 MG tablet Take 1 tablet (20 mg total) by mouth daily. 12/27/12   Phillips OdorJeffery Anderson, MD   History   Social History  .  Marital Status: Married    Spouse Name: N/A    Number of Children: N/A  . Years of Education: N/A   Occupational History  . Not on file.   Social History Main Topics  . Smoking status: Never Smoker   . Smokeless tobacco: Not on file  . Alcohol Use: Yes  . Drug Use: Not on file  . Sexual Activity: Not on file   Other Topics Concern  . Not on file   Social History Narrative   Marital status: married      Children: 2 children; 3 grandchildren.       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      Tobacco; none; quit in 2002.      Alcohol:  5 beers per day.      Exercise:  none   Family History  Problem Relation Age of Onset  . Hypertension Mother   . Hypertension Father     Review of Systems  Constitutional: Positive for chills and diaphoresis. Negative for fever.  HENT: Negative for ear pain, sore throat and trouble swallowing.   Eyes: Positive for visual disturbance. Negative for photophobia.  Respiratory: Positive for cough.   Cardiovascular: Negative for leg swelling.  Gastrointestinal: Positive for nausea, abdominal pain and constipation. Negative for vomiting, diarrhea, blood in stool, abdominal distention, anal bleeding and rectal pain.  Endocrine: Negative for cold intolerance, heat intolerance, polydipsia, polyphagia and polyuria.  Genitourinary: Negative for dysuria and frequency.  Skin: Negative for rash.  Neurological: Positive for headaches. Negative for dizziness, tremors, syncope, facial asymmetry, speech difficulty, weakness, light-headedness and numbness.  Psychiatric/Behavioral: Negative for confusion.      Objective:   Physical Exam  Nursing note and vitals reviewed. Constitutional: He is oriented to person, place, and time. He appears well-developed and well-nourished. No distress.  HENT:  Head: Normocephalic and atraumatic.  Right Ear: External ear normal.  Left Ear: External ear normal.  Nose: Nose normal.  Mouth/Throat: Oropharynx is clear and moist.  Eyes: Conjunctivae and EOM are normal. Pupils are equal, round, and reactive to light.  Neck: Normal range of motion. Neck supple. Carotid bruit is not present. No thyromegaly present.  Cardiovascular: Normal rate, regular rhythm, normal heart sounds and intact distal pulses.  Exam reveals no gallop and no friction rub.   No murmur heard. ZO-109/60,454/09  Pulmonary/Chest: Effort normal and breath sounds normal. No  respiratory distress. He has no wheezes. He has no rales.  Abdominal: Soft. Bowel sounds are normal. He exhibits no distension and no mass. There is no tenderness. There is no rebound and no guarding.  Musculoskeletal: Normal range of motion.  Lymphadenopathy:    He has no cervical adenopathy.  Neurological: He is alert and oriented to person, place, and time. No cranial nerve deficit. He exhibits normal muscle tone. Coordination normal.  Skin: Skin is warm and dry. No rash noted. He is not diaphoretic. No erythema.  Psychiatric: He has a normal mood and affect. His behavior is normal. Judgment and thought content normal.   Results for orders placed in visit on 08/19/13  POCT CBC      Result Value Ref Range   WBC 13.5 (*) 4.6 - 10.2 K/uL   Lymph, poc 2.4  0.6 - 3.4   POC LYMPH PERCENT 17.5  10 - 50 %L   MID (cbc) 1.2 (*) 0 - 0.9   POC MID % 9.2  0 - 12 %M   POC Granulocyte  9.9 (*) 2 - 6.9   Granulocyte percent 73.3  37 - 80 %G   RBC 4.99  4.69 - 6.13 M/uL   Hemoglobin 15.6  14.1 - 18.1 g/dL   HCT, POC 09.8  11.9 - 53.7 %   MCV 95.6  80 - 97 fL   MCH, POC 31.3 (*) 27 - 31.2 pg   MCHC 32.7  31.8 - 35.4 g/dL   RDW, POC 14.7     Platelet Count, POC 185  142 - 424 K/uL   MPV 9.0  0 - 99.8 fL  GLUCOSE, POCT (MANUAL RESULT ENTRY)      Result Value Ref Range   POC Glucose 128 (*) 70 - 99 mg/dl  POCT GLYCOSYLATED HEMOGLOBIN (HGB A1C)      Result Value Ref Range   Hemoglobin A1C 5.8    POCT URINALYSIS DIPSTICK      Result Value Ref Range   Color, UA dk yellow     Clarity, UA clear     Glucose, UA 100     Bilirubin, UA small     Ketones, UA 15     Spec Grav, UA >=1.030     Blood, UA neg     pH, UA 5.5     Protein, UA 30     Urobilinogen, UA 0.2     Nitrite, UA neg     Leukocytes, UA Negative    POCT UA - MICROSCOPIC ONLY      Result Value Ref Range   WBC, Ur, HPF, POC 0-3     RBC, urine, microscopic neg     Bacteria, U Microscopic small     Mucus, UA positive     Epithelial  cells, urine per micros 0-3     Crystals, Ur, HPF, POC neg     Casts, Ur, LPF, POC neg     Yeast, UA neg         Assessment & Plan:   1. Abdominal pain, unspecified site   2. Headache(784.0)   3. Type II or unspecified type diabetes mellitus without mention of complication, uncontrolled   4. Essential hypertension, benign   5. Pure hypercholesterolemia   6. Subarachnoid cyst    1. Abdominal pain:  New.  Now much improved; associated with nausea; benign abdominal exam; benign urine.  Recommend BRAT diet, hydration.  Rx for Zofran provided.  RTC for recurrence. 2.  Nausea: New.  Improved today; rx for Zofran provided. 3.  Headache: New.  Much improved today; mild in nature. Normal neurological exam.  Associated with headache, nausea.  Declined medication for headache. 4.  DMII: controlled; refill of Metformin provided; obtain labs; RTC six months. 5.  HTN: controlled; obtain labs; refill provided.  F/u six months. 6.  Hyperlipidemia: uncontrolled; tolerating medication; obtain labs; refill provided.  Meds ordered this encounter  Medications  . atorvastatin (LIPITOR) 40 MG tablet    Sig: Take 1 tablet (40 mg total) by mouth daily.    Dispense:  30 tablet    Refill:  5  . lisinopril (PRINIVIL,ZESTRIL) 20 MG tablet    Sig: Take 1 tablet (20 mg total) by mouth daily.    Dispense:  30 tablet    Refill:  5  . metFORMIN (GLUCOPHAGE XR) 500 MG 24 hr tablet    Sig: 2 daily with evening meal.    Dispense:  60 tablet    Refill:  5  . rosuvastatin (CRESTOR) 20 MG tablet    Sig: Take 1  tablet (20 mg total) by mouth daily.    Dispense:  30 tablet    Refill:  5  . ondansetron (ZOFRAN-ODT) 8 MG disintegrating tablet    Sig: Take 1 tablet (8 mg total) by mouth every 8 (eight) hours as needed for nausea.    Dispense:  20 tablet    Refill:  0    I personally performed the services described in this documentation, which was scribed in my presence.  The recorded information has been reviewed  and is accurate.  Nilda Simmer, M.D.  [facility] [address] [place], [place]  [address] [phone] phone [phone] fax

## 2013-08-23 ENCOUNTER — Other Ambulatory Visit: Payer: Self-pay

## 2013-08-23 MED ORDER — ATORVASTATIN CALCIUM 40 MG PO TABS: 40.0000 mg | ORAL_TABLET | Freq: Every day | ORAL | Status: DC

## 2013-08-24 ENCOUNTER — Telehealth: Payer: Self-pay

## 2013-08-24 DIAGNOSIS — E785 Hyperlipidemia, unspecified: Secondary | ICD-10-CM

## 2013-08-24 MED ORDER — ATORVASTATIN CALCIUM 40 MG PO TABS: 40.0000 mg | ORAL_TABLET | Freq: Every day | ORAL | Status: DC

## 2013-08-24 NOTE — Telephone Encounter (Signed)
Spoke with daughter. Pt used to be on Lipitor. Looks like we rx Crestor and this is too expensive. Can we change to Lipitor?

## 2013-08-24 NOTE — Telephone Encounter (Signed)
Pt needs to take with someone about medication and changing to  Generic

## 2013-08-24 NOTE — Telephone Encounter (Signed)
Sent in

## 2013-08-25 NOTE — Telephone Encounter (Signed)
LMVM RX sent to pharmacy. 

## 2013-10-06 ENCOUNTER — Other Ambulatory Visit: Payer: Self-pay

## 2013-10-06 MED ORDER — METFORMIN HCL ER 500 MG PO TB24: ORAL_TABLET | ORAL | Status: DC

## 2013-12-24 ENCOUNTER — Other Ambulatory Visit: Payer: Self-pay | Admitting: Emergency Medicine

## 2014-02-07 ENCOUNTER — Telehealth: Payer: Self-pay | Admitting: *Deleted

## 2014-02-07 NOTE — Telephone Encounter (Signed)
Called patient, per daughter he was unavailable. I advised her I was doing Diabetes/Cholesterol Management follow up calls for our patients. The patient lost his job and does not have insurance now, but he in the process of getting insurance. It will be through the Affordable Care Act. When he get the insurance or job he will come to the walk in clinic to check diabetes.

## 2014-03-12 ENCOUNTER — Other Ambulatory Visit: Payer: Self-pay | Admitting: Family Medicine

## 2014-03-31 ENCOUNTER — Other Ambulatory Visit: Payer: Self-pay | Admitting: Family Medicine

## 2014-04-06 ENCOUNTER — Other Ambulatory Visit: Payer: Self-pay | Admitting: Family Medicine

## 2014-04-30 ENCOUNTER — Other Ambulatory Visit: Payer: Self-pay | Admitting: Family Medicine

## 2014-05-08 ENCOUNTER — Other Ambulatory Visit: Payer: Self-pay | Admitting: Physician Assistant

## 2014-06-04 ENCOUNTER — Other Ambulatory Visit: Payer: Self-pay | Admitting: Physician Assistant

## 2014-06-27 ENCOUNTER — Other Ambulatory Visit: Payer: Self-pay | Admitting: Physician Assistant

## 2014-06-28 NOTE — Telephone Encounter (Signed)
LMOM to CB. Need f/up plan. We have been putting notices on RFs. Hasn't been seen since last April.

## 2014-07-03 NOTE — Telephone Encounter (Signed)
Pts daughter called and stated the pt has not been in bc he had lost his job and ins. However he has gotten a new job and ins and will come in to see the doctor either this weekend or next, whichever one the daughter is not working.  I advised I will send in 2 more weeks of metformin to cover him until then.

## 2014-07-12 ENCOUNTER — Ambulatory Visit (INDEPENDENT_AMBULATORY_CARE_PROVIDER_SITE_OTHER): Payer: 59 | Admitting: Family Medicine

## 2014-07-12 VITALS — BP 144/84 | HR 71 | Temp 97.7°F | Resp 20 | Ht 71.0 in | Wt 242.2 lb

## 2014-07-12 DIAGNOSIS — Z2821 Immunization not carried out because of patient refusal: Secondary | ICD-10-CM | POA: Diagnosis not present

## 2014-07-12 DIAGNOSIS — E782 Mixed hyperlipidemia: Secondary | ICD-10-CM

## 2014-07-12 DIAGNOSIS — E119 Type 2 diabetes mellitus without complications: Secondary | ICD-10-CM | POA: Diagnosis not present

## 2014-07-12 DIAGNOSIS — I10 Essential (primary) hypertension: Secondary | ICD-10-CM | POA: Diagnosis not present

## 2014-07-12 LAB — POCT GLYCOSYLATED HEMOGLOBIN (HGB A1C): Hemoglobin A1C: 6.8

## 2014-07-12 MED ORDER — LISINOPRIL 20 MG PO TABS: 20.0000 mg | ORAL_TABLET | Freq: Every day | ORAL | Status: DC

## 2014-07-12 MED ORDER — ATORVASTATIN CALCIUM 40 MG PO TABS: ORAL_TABLET | ORAL | Status: DC

## 2014-07-12 MED ORDER — METFORMIN HCL ER 500 MG PO TB24: 1000.0000 mg | ORAL_TABLET | Freq: Every day | ORAL | Status: DC

## 2014-07-12 NOTE — Progress Notes (Signed)
 Chief Complaint:  Chief Complaint  Patient presents with  . Diabetes  . Hypertension    HPI: Trindon Roza is a 64 y.o. male who is here for here for diabetes and HTN recheck, he is doing well He ran out of medicines and last took it was last night, he takes all medicines at night Diabetes-130s in the morning, denies numbness, tingling, vision change; svision check was 2 couple month No diabetic retinopathy, denies polydipsia/polyuria BP at home is about 140s/80s, denies chest pain or dizziness He is not up-to-date on any of his vaccines. He declines any vaccinations for pneumonia or influenza. He is compliant with his medications without any side effects.  He was diagnosed with diabetes about 2 years ago.   Wt Readings from Last 3 Encounters:  07/12/14 242 lb 4 oz (109.884 kg)  08/19/13 236 lb 9.6 oz (107.321 kg)  12/27/12 225 lb 6.4 oz (102.241 kg)   BP Readings from Last 3 Encounters:  07/12/14 144/84  08/19/13 130/80  12/27/12 120/82     Past Medical History  Diagnosis Date  . Hyperlipidemia   . Hypertension   . Diabetes mellitus without complication   . 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   History reviewed. No pertinent past surgical history. History   Social History  . Marital Status: Married    Spouse Name: N/A  . Number of Children: N/A  . Years of Education: N/A   Social History Main Topics  . Smoking status: Never Smoker   . Smokeless tobacco: Never Used  . Alcohol Use: 0.0 oz/week    0 Standard drinks or equivalent per week  . Drug Use: No  . Sexual Activity: Not on file   Other Topics Concern  . None   Social History Narrative   Marital status: married      Children: 2 children; 3 grandchildren.      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      Tobacco; none; quit in 2002.      Alcohol:  5 beers  per day.      Exercise:  none   Family History  Problem Relation Age of Onset  . Hypertension Mother   . Hypertension Father    No Known Allergies Prior to Admission medications   Medication Sig Start Date End Date Taking? Authorizing Provider  allopurinol (ZYLOPRIM) 100 MG tablet Take 100 mg by mouth daily.   Yes Historical Provider, MD  atorvastatin (LIPITOR) 40 MG tablet TAKE 1 TABLET (40 MG TOTAL) BY MOUTH DAILY.   "NEEDS OFFICE VISIT FOR ADDITIONAL REFILLS" 03/12/14  Yes Chelle S Jeffery, PA-C  lisinopril (PRINIVIL,ZESTRIL) 20 MG tablet Take 1 tablet (20 mg total) by mouth daily. PATIENT NEEDS OFFICE VISIT FOR ADDITIONAL REFILLS 05/09/14  Yes Morrell Riddle, PA-C  metFORMIN (GLUCOPHAGE-XR) 500 MG 24 hr tablet Take 2 tablets (1,000 mg total) by mouth daily. PATIENT NEEDS OFFICE VISIT FOR ADDITIONAL REFILLS 04/06/14  Yes Chelle S Jeffery, PA-C     ROS: The patient denies fevers, chills, night sweats, unintentional weight loss, chest pain, palpitations, wheezing, dyspnea on exertion, nausea, vomiting, abdominal pain, dysuria, hematuria, melena, numbness, weakness, or tingling.   All other systems have been reviewed and were otherwise negative with the exception of those mentioned in the HPI and as above.    PHYSICAL EXAM: Filed Vitals:  07/12/14 1926  BP: 144/84  Pulse: 71  Temp: 97.7 F (36.5 C)  Resp: 20   Filed Vitals:   07/12/14 1926  Height: 5\' 11"  (1.803 m)  Weight: 242 lb 4 oz (109.884 kg)   Body mass index is 33.8 kg/(m^2).  General: Alert, no acute distress HEENT:  Normocephalic, atraumatic, oropharynx patent. EOMI, PERRLA, gross funduscopic exam was normal. TM was normal. Cardiovascular:  Regular rate and rhythm, no rubs murmurs or gallops.  No Carotid bruits, radial pulse intact. No pedal edema.  Respiratory: Clear to auscultation bilaterally.  No wheezes, rales, or rhonchi.  No cyanosis, no use of accessory musculature GI: No organomegaly, abdomen is soft and  non-tender, positive bowel sounds.  No masses. Skin: No rashes. Neurologic: Facial musculature symmetric. microfilament exam was normal. Dorsalis pedal pulses were normal bilaterally.  Psychiatric: Patient is appropriate throughout our interaction. Lymphatic: No cervical lymphadenopathy Musculoskeletal: Gait intact.   LABS: Results for orders placed or performed in visit on 07/12/14  POCT glycosylated hemoglobin (Hb A1C)  Result Value Ref Range   Hemoglobin A1C 6.8      EKG/XRAY:   Primary read [facility]   ASSESSMENT/PLAN: Encounter Diagnoses  Name Primary?  . Essential hypertension, benign Yes  . Mixed hyperlipidemia   . Type 2 diabetes mellitus without complication   . Influenza vaccination declined    Mr. Sherrell PullerMendez is a pleasant 64 year old [place] at a here for recheck of hypertension, hyperlipidemia, diabetes type 2 without complications.  He declines any vaccination at this point.  Medications refilled for 7 months. He was told to returnin 3 months  recheck of DM Labs are pending: CMP, TSH, LDLsince he already ahs eaten, lipid panel.   Gross sideeffects, risk and benefits, and alternatives of medications d/w patient. Patient is aware that all medications have potential sideeffects and we are unable to predict every sideeffect or drug-drug interaction that may occur.  Hamilton CapriLE,  PHUONG, DO 07/12/2014 8:18 PM

## 2014-07-13 LAB — COMPLETE METABOLIC PANEL WITH GFR
ALT: 60 U/L — ABNORMAL HIGH (ref 0–53)
AST: 52 U/L — ABNORMAL HIGH (ref 0–37)
Albumin: 4.4 g/dL (ref 3.5–5.2)
Alkaline Phosphatase: 44 U/L (ref 39–117)
BUN: 19 mg/dL (ref 6–23)
CO2: 23 mEq/L (ref 19–32)
Calcium: 9.6 mg/dL (ref 8.4–10.5)
Chloride: 103 mEq/L (ref 96–112)
Creat: 1.06 mg/dL (ref 0.50–1.35)
GFR, Est African American: 87 mL/min
GFR, Est Non African American: 75 mL/min
Glucose, Bld: 120 mg/dL — ABNORMAL HIGH (ref 70–99)
Potassium: 4.2 mEq/L (ref 3.5–5.3)
Sodium: 137 mEq/L (ref 135–145)
Total Bilirubin: 0.8 mg/dL (ref 0.2–1.2)
Total Protein: 7.4 g/dL (ref 6.0–8.3)

## 2014-07-13 LAB — MICROALBUMIN, URINE: Microalb, Ur: 0.2 mg/dL (ref <2.0)

## 2014-07-13 LAB — TSH: TSH: 4.987 u[IU]/mL — ABNORMAL HIGH (ref 0.350–4.500)

## 2014-07-13 LAB — LDL CHOLESTEROL, DIRECT: Direct LDL: 121 mg/dL — ABNORMAL HIGH

## 2014-07-17 ENCOUNTER — Other Ambulatory Visit: Payer: Self-pay | Admitting: Physician Assistant

## 2014-07-19 ENCOUNTER — Encounter: Payer: Self-pay | Admitting: Family Medicine

## 2014-07-19 ENCOUNTER — Telehealth: Payer: Self-pay | Admitting: Family Medicine

## 2014-07-19 NOTE — Telephone Encounter (Signed)
Did not leave message. Will send letter. He does not speak english well

## 2014-07-22 ENCOUNTER — Other Ambulatory Visit: Payer: Self-pay | Admitting: Family Medicine

## 2014-07-22 DIAGNOSIS — R748 Abnormal levels of other serum enzymes: Secondary | ICD-10-CM

## 2014-07-22 DIAGNOSIS — R7989 Other specified abnormal findings of blood chemistry: Secondary | ICD-10-CM

## 2014-07-22 DIAGNOSIS — E785 Hyperlipidemia, unspecified: Secondary | ICD-10-CM

## 2014-08-10 ENCOUNTER — Telehealth: Payer: Self-pay | Admitting: Radiology

## 2014-11-03 ENCOUNTER — Ambulatory Visit (INDEPENDENT_AMBULATORY_CARE_PROVIDER_SITE_OTHER): Payer: 59 | Admitting: Family Medicine

## 2014-11-03 VITALS — BP 132/84 | HR 74 | Temp 97.9°F | Resp 16 | Ht 72.0 in | Wt 240.8 lb

## 2014-11-03 DIAGNOSIS — Z125 Encounter for screening for malignant neoplasm of prostate: Secondary | ICD-10-CM | POA: Diagnosis not present

## 2014-11-03 DIAGNOSIS — E11618 Type 2 diabetes mellitus with other diabetic arthropathy: Secondary | ICD-10-CM | POA: Diagnosis not present

## 2014-11-03 DIAGNOSIS — R7989 Other specified abnormal findings of blood chemistry: Secondary | ICD-10-CM

## 2014-11-03 DIAGNOSIS — E7800 Pure hypercholesterolemia, unspecified: Secondary | ICD-10-CM

## 2014-11-03 DIAGNOSIS — I10 Essential (primary) hypertension: Secondary | ICD-10-CM | POA: Diagnosis not present

## 2014-11-03 DIAGNOSIS — M1A0720 Idiopathic chronic gout, left ankle and foot, without tophus (tophi): Secondary | ICD-10-CM

## 2014-11-03 DIAGNOSIS — R945 Abnormal results of liver function studies: Secondary | ICD-10-CM

## 2014-11-03 LAB — COMPREHENSIVE METABOLIC PANEL
ALT: 53 U/L (ref 0–53)
AST: 37 U/L (ref 0–37)
Albumin: 4.5 g/dL (ref 3.5–5.2)
Alkaline Phosphatase: 37 U/L — ABNORMAL LOW (ref 39–117)
BUN: 18 mg/dL (ref 6–23)
CO2: 24 mEq/L (ref 19–32)
Calcium: 9.6 mg/dL (ref 8.4–10.5)
Chloride: 101 mEq/L (ref 96–112)
Creat: 0.98 mg/dL (ref 0.50–1.35)
Glucose, Bld: 138 mg/dL — ABNORMAL HIGH (ref 70–99)
Potassium: 4.7 mEq/L (ref 3.5–5.3)
Sodium: 137 mEq/L (ref 135–145)
Total Bilirubin: 0.8 mg/dL (ref 0.2–1.2)
Total Protein: 7.7 g/dL (ref 6.0–8.3)

## 2014-11-03 LAB — THYROID PANEL WITH TSH
Free Thyroxine Index: 1.5 (ref 1.4–3.8)
T3 Uptake: 29 % (ref 22–35)
T4, Total: 5.3 ug/dL (ref 4.5–12.0)
TSH: 1.801 u[IU]/mL (ref 0.350–4.500)

## 2014-11-03 LAB — LIPID PANEL
Cholesterol: 159 mg/dL (ref 0–200)
HDL: 49 mg/dL (ref >=40)
LDL Cholesterol: 92 mg/dL (ref 0–99)
Total CHOL/HDL Ratio: 3.2 Ratio
Triglycerides: 92 mg/dL (ref <150)
VLDL: 18 mg/dL (ref 0–40)

## 2014-11-03 LAB — POCT GLYCOSYLATED HEMOGLOBIN (HGB A1C): Hemoglobin A1C: 6.2

## 2014-11-03 LAB — URIC ACID: Uric Acid, Serum: 6 mg/dL (ref 4.0–7.8)

## 2014-11-03 NOTE — Patient Instructions (Addendum)
Gout Gout is when your joints become red, sore, and swell (inflamed). This is caused by the buildup of uric acid crystals in the joints. Uric acid is a chemical that is normally in the blood. If the level of uric acid gets too high in the blood, these crystals form in your joints and tissues. Over time, these crystals can form into masses near the joints and tissues. These masses can destroy bone and cause the bone to look misshapen (deformed). HOME CARE   Do not take aspirin for pain.  Only take medicine as told by your doctor.  Rest the joint as much as you can. When in bed, keep sheets and blankets off painful areas.  Keep the sore joints raised (elevated).  Put warm or cold packs on painful joints. Use of warm or cold packs depends on which works best for you.  Use crutches if the painful joint is in your leg.  Drink enough fluids to keep your pee (urine) clear or pale yellow. Limit alcohol, sugary drinks, and drinks with fructose in them.  Follow your diet instructions. Pay careful attention to how much protein you eat. Include fruits, vegetables, whole grains, and fat-free or low-fat milk products in your daily diet. Talk to your doctor or dietitian about the use of coffee, vitamin C, and cherries. These may help lower uric acid levels.  Keep a healthy body weight. GET HELP RIGHT AWAY IF:   You have watery poop (diarrhea), throw up (vomit), or have any side effects from medicines.  You do not feel better in 24 hours, or you are getting worse.  Your joint becomes suddenly more tender, and you have chills or a fever. MAKE SURE YOU:   Understand these instructions.  Will watch your condition.  Will get help right away if you are not doing well or get worse. Document Released: 02/04/2008 Document Revised: 09/11/2013 Document Reviewed: 12/09/2011 ExitCare Patient Information 2015 [employer]. This information is not intended to replace advice given to you by your health care  provider. Make sure you discuss any questions you have with your health care provider. Gout Gout is an inflammatory arthritis caused by a buildup of uric acid crystals in the joints. Uric acid is a chemical that is normally present in the blood. When the level of uric acid in the blood is too high it can form crystals that deposit in your joints and tissues. This causes joint redness, soreness, and swelling (inflammation). Repeat attacks are common. Over time, uric acid crystals can form into masses (tophi) near a joint, destroying bone and causing disfigurement. Gout is treatable and often preventable. CAUSES  The disease begins with elevated levels of uric acid in the blood. Uric acid is produced by your body when it breaks down a naturally found substance called purines. Certain foods you eat, such as meats and fish, contain high amounts of purines. Causes of an elevated uric acid level include:  Being passed down from parent to child (heredity).  Diseases that cause increased uric acid production (such as obesity, psoriasis, and certain cancers).  Excessive alcohol use.  Diet, especially diets rich in meat and seafood.  Medicines, including certain cancer-fighting medicines (chemotherapy), water pills (diuretics), and aspirin.  Chronic kidney disease. The kidneys are no longer able to remove uric acid well.  Problems with metabolism. Conditions strongly associated with gout include:  Obesity.  High blood pressure.  High cholesterol.  Diabetes. Not everyone with elevated uric acid levels gets gout. It is   not understood why some people get gout and others do not. Surgery, joint injury, and eating too much of certain foods are some of the factors that can lead to gout attacks. SYMPTOMS   An attack of gout comes on quickly. It causes intense pain with redness, swelling, and warmth in a joint.  Fever can occur.  Often, only one joint is involved. Certain joints are more commonly  involved:  Base of the big toe.  Knee.  Ankle.  Wrist.  Finger. Without treatment, an attack usually goes away in a few days to weeks. Between attacks, you usually will not have symptoms, which is different from many other forms of arthritis. DIAGNOSIS  Your caregiver will suspect gout based on your symptoms and exam. In some cases, tests may be recommended. The tests may include:  Blood tests.  Urine tests.  X-rays.  Joint fluid exam. This exam requires a needle to remove fluid from the joint (arthrocentesis). Using a microscope, gout is confirmed when uric acid crystals are seen in the joint fluid. TREATMENT  There are two phases to gout treatment: treating the sudden onset (acute) attack and preventing attacks (prophylaxis).  Treatment of an Acute Attack.  Medicines are used. These include anti-inflammatory medicines or steroid medicines.  An injection of steroid medicine into the affected joint is sometimes necessary.  The painful joint is rested. Movement can worsen the arthritis.  You may use warm or cold treatments on painful joints, depending which works best for you.  Treatment to Prevent Attacks.  If you suffer from frequent gout attacks, your caregiver may advise preventive medicine. These medicines are started after the acute attack subsides. These medicines either help your kidneys eliminate uric acid from your body or decrease your uric acid production. You may need to stay on these medicines for a very long time.  The early phase of treatment with preventive medicine can be associated with an increase in acute gout attacks. For this reason, during the first few months of treatment, your caregiver may also advise you to take medicines usually used for acute gout treatment. Be sure you understand your caregiver's directions. Your caregiver may make several adjustments to your medicine dose before these medicines are effective.  Discuss dietary treatment with your  caregiver or dietitian. Alcohol and drinks high in sugar and fructose and foods such as meat, poultry, and seafood can increase uric acid levels. Your caregiver or dietitian can advise you on drinks and foods that should be limited. HOME CARE INSTRUCTIONS   Do not take aspirin to relieve pain. This raises uric acid levels.  Only take over-the-counter or prescription medicines for pain, discomfort, or fever as directed by your caregiver.  Rest the joint as much as possible. When in bed, keep sheets and blankets off painful areas.  Keep the affected joint raised (elevated).  Apply warm or cold treatments to painful joints. Use of warm or cold treatments depends on which works best for you.  Use crutches if the painful joint is in your leg.  Drink enough fluids to keep your urine clear or pale yellow. This helps your body get rid of uric acid. Limit alcohol, sugary drinks, and fructose drinks.  Follow your dietary instructions. Pay careful attention to the amount of protein you eat. Your daily diet should emphasize fruits, vegetables, whole grains, and fat-free or low-fat milk products. Discuss the use of coffee, vitamin C, and cherries with your caregiver or dietitian. These may be helpful in lowering uric acid  levels.  Maintain a healthy body weight. SEEK MEDICAL CARE IF:   You develop diarrhea, vomiting, or any side effects from medicines.  You do not feel better in 24 hours, or you are getting worse. SEEK IMMEDIATE MEDICAL CARE IF:   Your joint becomes suddenly more tender, and you have chills or a fever. MAKE SURE YOU:   Understand these instructions.  Will watch your condition.  Will get help right away if you are not doing well or get worse. Document Released: 04/24/2000 Document Revised: 09/11/2013 Document Reviewed: 12/09/2011 ExitCare Patient Information 2015 [employer]. This information is not intended to replace advice given to you by your health care provider. Make  sure you discuss any questions you have with your health care provider.  

## 2014-11-04 ENCOUNTER — Encounter: Payer: Self-pay | Admitting: Family Medicine

## 2014-11-04 MED ORDER — METFORMIN HCL ER 500 MG PO TB24
1000.0000 mg | ORAL_TABLET | Freq: Every day | ORAL | Status: DC
Start: 2014-11-04 — End: 2015-08-19

## 2014-11-04 MED ORDER — LISINOPRIL 20 MG PO TABS: 20.0000 mg | ORAL_TABLET | Freq: Every day | ORAL | Status: DC

## 2014-11-04 MED ORDER — ATORVASTATIN CALCIUM 40 MG PO TABS: ORAL_TABLET | ORAL | Status: DC

## 2014-11-04 MED ORDER — ALLOPURINOL 100 MG PO TABS
100.0000 mg | ORAL_TABLET | Freq: Every day | ORAL | Status: DC
Start: 2014-11-04 — End: 2016-01-21

## 2014-11-04 NOTE — Progress Notes (Signed)
Subjective:  This chart was scribed for Darren Sorenson, MD by [facility], medical scribe at [facility].The patient was seen in exam room 13 and the patient's care was started at 9:07 AM.   Patient ID: Darren Gutierrez, male    DOB: 1950-12-18, 64 y.o.   MRN: [id number] Chief Complaint  Patient presents with  . Follow-up    diabetes  . Referral    [facility]  . 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 His new surance requires a new authorization to continue seeing [facility]. He is seeing Dr. Corliss Skains for gout. Pt does not speak english and his daughter was present to translate.  Gout: No recent gout flare ups. Most flare ups are in his left knee and left ankle. He is taking allopurinol. No side effects with this medication.  He drinks red wine occ, no beer or liquor.   Diabetes: He is checking his blood sugar at home, no symptomatic highs. No numbness in his feet. No side effects with his medication. Normal urine micro albumin. No urinary symptoms.  Hyperlipidemia: Cholesterol was elevated at 07/12/2014 visit.  Past Medical History  Diagnosis Date  . Hyperlipidemia   . Hypertension   . Diabetes mellitus without complication   . 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   No current outpatient prescriptions on file prior to visit.   No current facility-administered medications on file prior to visit.   No Known Allergies  Review of Systems  Constitutional: Negative for fever, chills, activity change, appetite change and unexpected weight change.  Eyes: Negative for visual disturbance.  Respiratory: Negative for cough, chest tightness and shortness of breath.   Cardiovascular: Negative for chest pain, palpitations and leg  swelling.  Gastrointestinal: Negative for nausea, vomiting and abdominal pain.  Musculoskeletal: Negative for myalgias, joint swelling and gait problem.  Skin: Negative for color change and rash.  Neurological: Negative for dizziness, syncope, facial asymmetry, weakness, light-headedness, numbness and headaches.        Objective:  BP 132/84 mmHg  Pulse 74  Temp(Src) 97.9 F (36.6 C) (Oral)  Resp 16  Ht 6' (1.829 m)  Wt 240 lb 12.8 oz (109.226 kg)  BMI 32.65 kg/m2  SpO2 98% Physical Exam  Constitutional: He is oriented to person, place, and time. He appears well-developed and well-nourished. No distress.  HENT:  Head: Normocephalic and atraumatic.  Eyes: Pupils are equal, round, and reactive to light.  Neck: Normal range of motion. No thyromegaly present.  Cardiovascular: Normal rate, regular rhythm, S1 normal and normal heart sounds.   No murmur heard. Split S2  Pulmonary/Chest: Effort normal and breath sounds normal. No respiratory distress. He has no wheezes.  Musculoskeletal: Normal range of motion.  Lymphadenopathy:    He has no cervical adenopathy.  Neurological: He is alert and oriented to person, place, and time.  Skin: Skin is warm and dry.  Psychiatric: He has a normal mood and affect. His behavior is normal.  Nursing note and vitals reviewed.  Results for orders placed or performed in visit on 11/03/14  PSA  Result Value Ref Range   PSA  <=4.00 ng/mL  Uric Acid  Result Value Ref Range   Uric Acid, Serum 6.0 4.0 - 7.8  mg/dL  Thyroid Panel With TSH  Result Value Ref Range   T4, Total 5.3 4.5 - 12.0 ug/dL   T3 Uptake 29 22 - 35 %   Free Thyroxine Index 1.5 1.4 - 3.8   TSH 1.801 0.350 - 4.500 uIU/mL  Lipid panel  Result Value Ref Range   Cholesterol 159 0 - 200 mg/dL   Triglycerides 92 <161 mg/dL   HDL 49 >=09 mg/dL   Total CHOL/HDL Ratio 3.2 Ratio   VLDL 18 0 - 40 mg/dL   LDL Cholesterol 92 0 - 99 mg/dL  Comprehensive metabolic panel  Result Value Ref  Range   Sodium 137 135 - 145 mEq/L   Potassium 4.7 3.5 - 5.3 mEq/L   Chloride 101 96 - 112 mEq/L   CO2 24 19 - 32 mEq/L   Glucose, Bld 138 (H) 70 - 99 mg/dL   BUN 18 6 - 23 mg/dL   Creat 6.04 5.40 - 9.81 mg/dL   Total Bilirubin 0.8 0.2 - 1.2 mg/dL   Alkaline Phosphatase 37 (L) 39 - 117 U/L   AST 37 0 - 37 U/L   ALT 53 0 - 53 U/L   Total Protein 7.7 6.0 - 8.3 g/dL   Albumin 4.5 3.5 - 5.2 g/dL   Calcium 9.6 8.4 - 19.1 mg/dL  POCT glycosylated hemoglobin (Hb A1C)  Result Value Ref Range   Hemoglobin A1C 6.2       Assessment & Plan:   1. Idiopathic chronic gout of left ankle without tophus - uric acid 6 so cont same dose of allopurinol. F/u w/ Dr. Corliss Skains prn - referral placed per pt's request.  2. Type 2 diabetes mellitus with other diabetic arthropathy - hgba1c improved from 6.8 -> 6.2, nml U microalb 3/16.   3. Essential hypertension, benign - controlled, cont same meds  4. Pure hypercholesterolemia - at goal w non-hdl 110, refilled statin  5. Abnormal thyroid stimulating hormone (TSH) level - tsh normalized, no further f/u needed at this time  6. Screening for prostate cancer   7. Elevated liver function tests - resolved, cont to follow q6 mos    Orders Placed This Encounter  Procedures  . PSA  . Uric Acid  . Thyroid Panel With TSH  . Lipid panel    Order Specific Question:  Has the patient fasted?    Answer:  Yes  . Comprehensive metabolic panel    Order Specific Question:  Has the patient fasted?    Answer:  Yes  . Ambulatory referral to Rheumatology    Referral Priority:  Routine    Referral Type:  Consultation    Referral Reason:  Specialty Services Required    Requested Specialty:  Rheumatology    Number of Visits Requested:  1  . POCT glycosylated hemoglobin (Hb A1C)     I personally performed the services described in this documentation, which was scribed in my presence. The recorded information has been reviewed and considered, and addended by me as  needed.  Darren Sorenson, MD MPH  Results for orders placed or performed in visit on 11/03/14  PSA  Result Value Ref Range   PSA  <=4.00 ng/mL  Uric Acid  Result Value Ref Range   Uric Acid, Serum 6.0 4.0 - 7.8 mg/dL  Thyroid Panel With TSH  Result Value Ref Range   T4, Total 5.3 4.5 - 12.0 ug/dL   T3 Uptake 29 22 - 35 %   Free Thyroxine Index 1.5 1.4 - 3.8  TSH 1.801 0.350 - 4.500 uIU/mL  Lipid panel  Result Value Ref Range   Cholesterol 159 0 - 200 mg/dL   Triglycerides 92 <161 mg/dL   HDL 49 >=09 mg/dL   Total CHOL/HDL Ratio 3.2 Ratio   VLDL 18 0 - 40 mg/dL   LDL Cholesterol 92 0 - 99 mg/dL  Comprehensive metabolic panel  Result Value Ref Range   Sodium 137 135 - 145 mEq/L   Potassium 4.7 3.5 - 5.3 mEq/L   Chloride 101 96 - 112 mEq/L   CO2 24 19 - 32 mEq/L   Glucose, Bld 138 (H) 70 - 99 mg/dL   BUN 18 6 - 23 mg/dL   Creat 6.04 5.40 - 9.81 mg/dL   Total Bilirubin 0.8 0.2 - 1.2 mg/dL   Alkaline Phosphatase 37 (L) 39 - 117 U/L   AST 37 0 - 37 U/L   ALT 53 0 - 53 U/L   Total Protein 7.7 6.0 - 8.3 g/dL   Albumin 4.5 3.5 - 5.2 g/dL   Calcium 9.6 8.4 - 19.1 mg/dL  POCT glycosylated hemoglobin (Hb A1C)  Result Value Ref Range   Hemoglobin A1C 6.2

## 2014-11-05 LAB — PSA: PSA: 0.92 ng/mL (ref <=4.00)

## 2014-11-08 ENCOUNTER — Encounter: Payer: Self-pay | Admitting: Family Medicine

## 2015-08-19 ENCOUNTER — Other Ambulatory Visit: Payer: Self-pay | Admitting: Family Medicine

## 2015-09-26 ENCOUNTER — Other Ambulatory Visit: Payer: Self-pay | Admitting: Family Medicine

## 2016-01-20 ENCOUNTER — Ambulatory Visit (INDEPENDENT_AMBULATORY_CARE_PROVIDER_SITE_OTHER): Payer: 59

## 2016-01-20 ENCOUNTER — Ambulatory Visit (INDEPENDENT_AMBULATORY_CARE_PROVIDER_SITE_OTHER): Payer: 59 | Admitting: Physician Assistant

## 2016-01-20 VITALS — BP 152/86 | HR 80 | Temp 97.8°F | Resp 17 | Ht 72.0 in | Wt 238.0 lb

## 2016-01-20 DIAGNOSIS — M79641 Pain in right hand: Secondary | ICD-10-CM | POA: Diagnosis not present

## 2016-01-20 DIAGNOSIS — E119 Type 2 diabetes mellitus without complications: Secondary | ICD-10-CM

## 2016-01-20 DIAGNOSIS — M25512 Pain in left shoulder: Secondary | ICD-10-CM

## 2016-01-20 DIAGNOSIS — M19012 Primary osteoarthritis, left shoulder: Secondary | ICD-10-CM

## 2016-01-20 DIAGNOSIS — M159 Polyosteoarthritis, unspecified: Secondary | ICD-10-CM

## 2016-01-20 DIAGNOSIS — E790 Hyperuricemia without signs of inflammatory arthritis and tophaceous disease: Secondary | ICD-10-CM

## 2016-01-20 DIAGNOSIS — I10 Essential (primary) hypertension: Secondary | ICD-10-CM

## 2016-01-20 DIAGNOSIS — E785 Hyperlipidemia, unspecified: Secondary | ICD-10-CM

## 2016-01-20 LAB — POCT GLYCOSYLATED HEMOGLOBIN (HGB A1C): Hemoglobin A1C: 7

## 2016-01-20 MED ORDER — MELOXICAM 7.5 MG PO TABS: 7.5000 mg | ORAL_TABLET | Freq: Every day | ORAL | 0 refills | Status: DC

## 2016-01-20 NOTE — Progress Notes (Deleted)
   01/20/2016 5:53 PM   DOB: 1950-07-02 / MRN: [id number]  SUBJECTIVE:  Darren Gutierrez is a 65 y.o. male presenting for   He has No Known Allergies.   He  has a past medical history of Diabetes mellitus without complication (HCC); Hyperlipidemia; Hypertension; and Subarachnoid cyst (12/09/2012).    He  reports that he has never smoked. He has never used smokeless tobacco. He reports that he drinks alcohol. He reports that he does not use drugs. He  has no sexual activity history on file. The patient  has no past surgical history on file.  His family history includes Hypertension in his father and mother.  ROS  The problem list and medications were reviewed and updated by myself where necessary and exist elsewhere in the encounter.   OBJECTIVE:  BP (!) 152/86 (BP Location: Right Arm, Patient Position: Sitting, Cuff Size: Large)   Pulse 80   Temp 97.8 F (36.6 C) (Oral)   Resp 17   Ht 6' (1.829 m)   Wt 238 lb (108 kg)   SpO2 94%   BMI 32.28 kg/m   Physical Exam  No results found for this or any previous visit (from the past 72 hour(s)).  No results found.  ASSESSMENT AND PLAN  There are no diagnoses linked to this encounter.  The patient is advised to call or return to clinic if he does not see an improvement in symptoms, or to seek the care of the closest emergency department if he worsens with the above plan.   Deliah BostonMichael Kaytlen Lightsey, MHS, PA-C [facility] 01/20/2016 5:53 PM

## 2016-01-20 NOTE — Progress Notes (Signed)
01/24/2016 8:30 AM   DOB: 12-10-1950 / MRN: [id number]  SUBJECTIVE:  Darren Gutierrez is a 65 y.o. male presenting for left shoulder pain that he describes as an ache, is made worse with movement, is better with rest, and has been present for about two months.  He has a history of gout however reports this feels different from that problem.  Denies neck pain, weakness, change in function, and paresthesia.  The pain does not radiate.    He complains of left hand pain present for one month and this does feel like gout.  Complains that this pain ha[p;s been present for one month and also complains of some swelling about the hand.  He denies any tenderness about the skin and rash.  He is not getting worse or better.   He has No Known Allergies.   He  has a past medical history of Diabetes mellitus without complication (HCC); Hyperlipidemia; Hypertension; and Subarachnoid cyst (12/09/2012).    He  reports that he has never smoked. He has never used smokeless tobacco. He reports that he drinks alcohol. He reports that he does not use drugs. He  has no sexual activity history on file. The patient  has no past surgical history on file.  His family history includes Hypertension in his father and mother.  Review of Systems  Constitutional: Negative for chills and fever.  Respiratory: Negative for shortness of breath.   Cardiovascular: Negative for leg swelling.  Gastrointestinal: Negative for nausea.  Skin: Negative for rash.  Neurological: Negative for dizziness and headaches.  Psychiatric/Behavioral: Negative for depression.    The problem list and medications were reviewed and updated by myself where necessary and exist elsewhere in the encounter.   OBJECTIVE:  BP (!) 152/86 (BP Location: Right Arm, Patient Position: Sitting, Cuff Size: Large)   Pulse 80   Temp 97.8 F (36.6 C) (Oral)   Resp 17   Ht 6' (1.829 m)   Wt 238 lb (108 kg)   SpO2 94%   BMI 32.28 kg/m   Physical Exam    Constitutional: He is oriented to person, place, and time.  Cardiovascular: Normal rate.   Pulmonary/Chest: Effort normal.  Musculoskeletal: Normal range of motion. He exhibits no edema, tenderness or deformity.       Left shoulder: He exhibits normal range of motion, no tenderness, no bony tenderness and no swelling.       Right hand: Normal. Normal strength noted.  Neurological: He is alert and oriented to person, place, and time.  Skin: No rash noted.  Vitals reviewed.   No results found for this or any previous visit (from the past 72 hour(s)). Lab Results  Component Value Date   CREATININE 0.98 11/03/2014    Recent Results (from the past 2160 hour(s))  Uric Acid     Status: Abnormal   Collection Time: 01/20/16  6:10 PM  Result Value Ref Range   Uric Acid, Serum 9.3 (H) 4.0 - 8.0 mg/dL    Comment: Therapeutic target for gout patients: <6.0 mg/dL  Sedimentation Rate     Status: None   Collection Time: 01/20/16  6:10 PM  Result Value Ref Range   Sed Rate 1 0 - 20 mm/hr  POCT glycosylated hemoglobin (Hb A1C)     Status: None   Collection Time: 01/20/16  6:23 PM  Result Value Ref Range   Hemoglobin A1C 7.0      No results found.  ASSESSMENT AND PLAN  Dreyson was seen  today for gout.  Diagnoses and all orders for this visit:  Right hand pain -     DG Hand Complete Right; Future -     Uric Acid -     Sedimentation Rate -     POCT glycosylated hemoglobin (Hb A1C)  Left shoulder pain -     DG Shoulder Left; Future  Primary osteoarthritis of left shoulder -     meloxicam (MOBIC) 7.5 MG tablet; Take 1 tablet (7.5 mg total) by mouth daily. Take 1000 mg of tylenol every 8 hours as needed along with this medication.  Generalized osteoarthritis of hand -     meloxicam (MOBIC) 7.5 MG tablet; Take 1 tablet (7.5 mg total) by mouth daily. Take 1000 mg of tylenol every 8 hours as needed along with this medication.  Elevated blood uric acid level -     allopurinol  (ZYLOPRIM) 100 MG tablet; Take 1 tablet (100 mg total) by mouth daily.  Elevated uric acid in blood  Essential hypertension -     allopurinol (ZYLOPRIM) 100 MG tablet; Take 1 tablet (100 mg total) by mouth daily. -     lisinopril (PRINIVIL,ZESTRIL) 20 MG tablet; Take 1 tablet (20 mg total) by mouth daily.  Dyslipidemia -     atorvastatin (LIPITOR) 40 MG tablet; Take 1 tablet (40 mg total) by mouth daily.  Controlled type 2 diabetes mellitus without complication, without long-term current use of insulin (HCC) -     metFORMIN (GLUCOPHAGE-XR) 500 MG 24 hr tablet; Take 2 tablets (1,000 mg total) by mouth daily with breakfast.    The patient is advised to call or return to clinic if he does not see an improvement in symptoms, or to seek the care of the closest emergency department if he worsens with the above plan.   Deliah Boston, MHS, PA-C [facility] 01/24/2016 8:30 AM

## 2016-01-20 NOTE — Patient Instructions (Signed)
     IF you received an x-ray today, you will receive an invoice from [facility]. Please contact [facility] at [phone] with questions or concerns regarding your invoice.   IF you received labwork today, you will receive an invoice from [facility]. Please contact [facility] at [phone] with questions or concerns regarding your invoice.   Our billing staff will not be able to assist you with questions regarding bills from these companies.  You will be contacted with the lab results as soon as they are available. The fastest way to get your results is to activate your My Chart account. Instructions are located on the last page of this paperwork. If you have not heard from us regarding the results in 2 weeks, please contact this office.      

## 2016-01-21 ENCOUNTER — Encounter: Payer: Self-pay | Admitting: Physician Assistant

## 2016-01-21 LAB — URIC ACID: Uric Acid, Serum: 9.3 mg/dL — ABNORMAL HIGH (ref 4.0–8.0)

## 2016-01-22 LAB — SEDIMENTATION RATE: Sed Rate: 1 mm/hr (ref 0–20)

## 2016-01-24 MED ORDER — ALLOPURINOL 100 MG PO TABS: 100.0000 mg | ORAL_TABLET | Freq: Every day | ORAL | 1 refills | Status: DC

## 2016-01-24 MED ORDER — LISINOPRIL 20 MG PO TABS: 20.0000 mg | ORAL_TABLET | Freq: Every day | ORAL | 1 refills | Status: DC

## 2016-01-24 MED ORDER — METFORMIN HCL ER 500 MG PO TB24: 1000.0000 mg | ORAL_TABLET | Freq: Every day | ORAL | 1 refills | Status: DC

## 2016-01-24 MED ORDER — ATORVASTATIN CALCIUM 40 MG PO TABS: 40.0000 mg | ORAL_TABLET | Freq: Every day | ORAL | 1 refills | Status: DC

## 2016-01-24 NOTE — Progress Notes (Signed)
LMTRC jp/cma  

## 2016-01-24 NOTE — Progress Notes (Signed)
Patient being contacted by Ms. Pack CMA.  I am restarting his medications and will see him back in six months for an annual physical.

## 2016-02-10 ENCOUNTER — Other Ambulatory Visit: Payer: Self-pay | Admitting: Physician Assistant

## 2016-02-10 DIAGNOSIS — M159 Polyosteoarthritis, unspecified: Secondary | ICD-10-CM

## 2016-02-10 DIAGNOSIS — M19012 Primary osteoarthritis, left shoulder: Secondary | ICD-10-CM

## 2016-02-11 NOTE — Telephone Encounter (Signed)
01/2016 last ov and labs 10/2015 last screening labs

## 2016-03-02 ENCOUNTER — Telehealth: Payer: Self-pay | Admitting: *Deleted

## 2016-03-02 NOTE — Telephone Encounter (Signed)
Patient daughter notified of lab results from 01/20/16

## 2016-04-30 ENCOUNTER — Other Ambulatory Visit: Payer: Self-pay | Admitting: Physician Assistant

## 2016-04-30 DIAGNOSIS — M19012 Primary osteoarthritis, left shoulder: Secondary | ICD-10-CM

## 2016-04-30 DIAGNOSIS — M159 Polyosteoarthritis, unspecified: Secondary | ICD-10-CM

## 2016-04-30 DIAGNOSIS — E119 Type 2 diabetes mellitus without complications: Secondary | ICD-10-CM

## 2017-01-09 ENCOUNTER — Ambulatory Visit (INDEPENDENT_AMBULATORY_CARE_PROVIDER_SITE_OTHER): Payer: BLUE CROSS/BLUE SHIELD

## 2017-01-09 ENCOUNTER — Ambulatory Visit (INDEPENDENT_AMBULATORY_CARE_PROVIDER_SITE_OTHER): Payer: BLUE CROSS/BLUE SHIELD | Admitting: Physician Assistant

## 2017-01-09 ENCOUNTER — Encounter: Payer: Self-pay | Admitting: Physician Assistant

## 2017-01-09 VITALS — BP 166/100 | HR 77 | Temp 98.6°F | Resp 17 | Ht 72.5 in | Wt 239.0 lb

## 2017-01-09 DIAGNOSIS — M545 Low back pain: Secondary | ICD-10-CM | POA: Diagnosis not present

## 2017-01-09 DIAGNOSIS — M4690 Unspecified inflammatory spondylopathy, site unspecified: Secondary | ICD-10-CM

## 2017-01-09 DIAGNOSIS — E119 Type 2 diabetes mellitus without complications: Secondary | ICD-10-CM

## 2017-01-09 DIAGNOSIS — I10 Essential (primary) hypertension: Secondary | ICD-10-CM | POA: Diagnosis not present

## 2017-01-09 DIAGNOSIS — R3911 Hesitancy of micturition: Secondary | ICD-10-CM

## 2017-01-09 DIAGNOSIS — E785 Hyperlipidemia, unspecified: Secondary | ICD-10-CM

## 2017-01-09 DIAGNOSIS — M47819 Spondylosis without myelopathy or radiculopathy, site unspecified: Secondary | ICD-10-CM

## 2017-01-09 DIAGNOSIS — R81 Glycosuria: Secondary | ICD-10-CM

## 2017-01-09 LAB — POCT URINALYSIS DIP (MANUAL ENTRY)
Bilirubin, UA: NEGATIVE
Blood, UA: NEGATIVE
Glucose, UA: 500 mg/dL — AB
Leukocytes, UA: NEGATIVE
Nitrite, UA: NEGATIVE
Protein Ur, POC: NEGATIVE mg/dL
Spec Grav, UA: 1.02 (ref 1.010–1.025)
Urobilinogen, UA: 0.2 E.U./dL
pH, UA: 5.5 (ref 5.0–8.0)

## 2017-01-09 LAB — POCT CBC
Granulocyte percent: 54.7 %G (ref 37–80)
HCT, POC: 52.4 % (ref 43.5–53.7)
Hemoglobin: 18 g/dL (ref 14.1–18.1)
Lymph, poc: 2.2 (ref 0.6–3.4)
MCH, POC: 32.2 pg — AB (ref 27–31.2)
MCHC: 34.3 g/dL (ref 31.8–35.4)
MCV: 93.9 fL (ref 80–97)
MID (cbc): 0.5 (ref 0–0.9)
MPV: 7.9 fL (ref 0–99.8)
POC Granulocyte: 3.3 (ref 2–6.9)
POC LYMPH PERCENT: 36.2 %L (ref 10–50)
POC MID %: 9.1 %M (ref 0–12)
Platelet Count, POC: 167 10*3/uL (ref 142–424)
RBC: 5.58 M/uL (ref 4.69–6.13)
RDW, POC: 13.2 %
WBC: 6 10*3/uL (ref 4.6–10.2)

## 2017-01-09 LAB — POCT GLYCOSYLATED HEMOGLOBIN (HGB A1C): Hemoglobin A1C: 8

## 2017-01-09 LAB — POC MICROSCOPIC URINALYSIS (UMFC): Mucus: ABSENT

## 2017-01-09 MED ORDER — ACETAMINOPHEN 500 MG PO TABS
1000.0000 mg | ORAL_TABLET | Freq: Once | ORAL | Status: AC
  Administered 2017-01-09: 1000 mg via ORAL

## 2017-01-09 MED ORDER — LISINOPRIL-HYDROCHLOROTHIAZIDE 20-25 MG PO TABS: 1.0000 | ORAL_TABLET | Freq: Every day | ORAL | 3 refills | Status: AC

## 2017-01-09 MED ORDER — METFORMIN HCL ER 500 MG PO TB24: 1500.0000 mg | ORAL_TABLET | Freq: Every day | ORAL | 1 refills | Status: DC

## 2017-01-09 MED ORDER — ACETAMINOPHEN 500 MG PO TABS: 1000.0000 mg | ORAL_TABLET | Freq: Three times a day (TID) | ORAL | 99 refills | Status: AC | PRN

## 2017-01-09 MED ORDER — ATORVASTATIN CALCIUM 40 MG PO TABS: 40.0000 mg | ORAL_TABLET | Freq: Every day | ORAL | 1 refills | Status: DC

## 2017-01-09 NOTE — Progress Notes (Signed)
01/09/2017 11:20 AM   DOB: 03/01/1951 / MRN: [id number]  SUBJECTIVE:  Darren Gutierrez is a 66 y.o. male presenting for left lower low back pain and difficulty with urination.  Denies nausea, fever, chills.  Feels that he is getting worse.  Does have a history of nephrolithiasis.  No history of prostatitis.    He has No Known Allergies.   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    He  reports that he has never smoked. He has never used smokeless tobacco. He reports that he drinks alcohol. He reports that he does not use drugs. He  reports that he does not engage in sexual activity. The patient  has no past surgical history on file.  His family history includes Hypertension in his father and mother.  Review of Systems  Constitutional: Negative for chills, diaphoresis and fever.  Gastrointestinal: Negative for nausea.  Skin: Negative for rash.  Neurological: Negative for dizziness.    The problem list and medications were reviewed and updated by myself where necessary and exist elsewhere in the encounter.   OBJECTIVE:  BP (!) 166/100 (BP Location: Left Arm, Cuff Size: Normal)   Pulse 77   Temp 98.6 F (37 C) (Oral)   Resp 17   Ht 6' 0.5" (1.842 m)   Wt 239 lb (108.4 kg)   SpO2 98%   BMI 31.97 kg/m   BP Readings from Last 3 Encounters:  01/09/17 (!) 166/100  01/20/16 (!) 152/86  11/03/14 132/84     Physical Exam  Constitutional: He appears well-developed. He is active and cooperative.  Non-toxic appearance.  Cardiovascular: Normal rate.   Pulmonary/Chest: Effort normal. No tachypnea.  Genitourinary: Prostate normal. Prostate is not enlarged and not tender.  Neurological: He is alert.  Skin: Skin is warm and dry. He is not diaphoretic. No pallor.  Vitals reviewed.   .  Results for orders placed or performed in visit on 01/09/17 (from the past 72 hour(s))    POCT urinalysis dipstick     Status: Abnormal   Collection Time: 01/09/17 10:18 AM  Result Value Ref Range   Color, UA yellow yellow   Clarity, UA clear clear   Glucose, UA =500 (A) negative mg/dL   Bilirubin, UA negative negative   Ketones, POC UA small (15) (A) negative mg/dL   Spec Grav, UA 1.020 1.010 - 1.025   Blood, UA negative negative   pH, UA 5.5 5.0 - 8.0   Protein Ur, POC negative negative mg/dL   Urobilinogen, UA 0.2 0.2 or 1.0 E.U./dL   Nitrite, UA Negative Negative   Leukocytes, UA Negative Negative  POCT CBC     Status: Abnormal   Collection Time: 01/09/17 10:20 AM  Result Value Ref Range   WBC 6.0 4.6 - 10.2 K/uL   Lymph, poc 2.2 0.6 - 3.4   POC LYMPH PERCENT 36.2 10 - 50 %L   MID (cbc) 0.5 0 - 0.9   POC MID % 9.1 0 - 12 %M   POC Granulocyte 3.3 2 - 6.9   Granulocyte percent 54.7 37 - 80 %G   RBC 5.58 4.69 - 6.13 M/uL   Hemoglobin 18.0 14.1 - 18.1 g/dL   HCT, POC 52.4 43.5 - 53.7 %   MCV 93.9 80 - 97 fL   MCH, POC 32.2 (A) 27 - 31.2 pg   MCHC 34.3 31.8 - 35.4 g/dL   RDW, POC  13.2 %   Platelet Count, POC 167 142 - 424 K/uL   MPV 7.9 0 - 99.8 fL  POCT Microscopic Urinalysis (UMFC)     Status: Abnormal   Collection Time: 01/09/17 10:27 AM  Result Value Ref Range   WBC,UR,HPF,POC None None WBC/hpf   RBC,UR,HPF,POC None None RBC/hpf   Bacteria None None, Too numerous to count   Mucus Absent Absent   Epithelial Cells, UR Per Microscopy Few (A) None, Too numerous to count cells/hpf  POCT glycosylated hemoglobin (Hb A1C)     Status: None   Collection Time: 01/09/17 10:44 AM  Result Value Ref Range   Hemoglobin A1C 8.0    Lab Results  Component Value Date   CHOL 159 11/03/2014   HDL 49 11/03/2014   LDLCALC 92 11/03/2014   LDLDIRECT 121 (H) 07/12/2014   TRIG 92 11/03/2014   CHOLHDL 3.2 11/03/2014    Dg Lumbar Spine Complete  Result Date: 01/09/2017 CLINICAL DATA:  Acute left back pain with sciatica, unspecified EXAM: LUMBAR SPINE - COMPLETE 4+ VIEW  COMPARISON:  None available FINDINGS: Lumbar degenerative spondylosis at multiple levels but most pronounced at L3-4 with marked disc space narrowing, sclerosis and endplate osteophytes. Lower lumbar facet arthropathy at L4-5 and L5-S1. Preserved vertebral body heights. No acute compression fracture or wedge-shaped deformity. Aorta is atherosclerotic. No pars defects. Pedicles appear intact. Minor associated levoscoliosis of the lumbar spine. Normal bowel gas pattern. IMPRESSION: Degenerative changes of the lumbar spine, most pronounced at L3-4 as above. Associated mild levoscoliosis. No acute finding by plain radiography Atherosclerosis Electronically Signed   By: Jerilynn Mages.  Shick M.D.   On: 01/09/2017 10:30    ASSESSMENT AND PLAN:  [name] was seen today for back pain.  Diagnoses and all orders for this visit:  Urinary hesitancy -     PSA -     POCT urinalysis dipstick -     POCT Microscopic Urinalysis (UMFC) -     POCT CBC -     CMP14+EGFR  Acute left-sided low back pain, with sciatica presence unspecified -     DG Lumbar Spine Complete; Future -     acetaminophen (TYLENOL) tablet 1,000 mg; Take 2 tablets (1,000 mg total) by mouth once.  Uncontrolled hypertension -     lisinopril-hydrochlorothiazide (PRINZIDE,ZESTORETIC) 20-25 MG tablet; Take 1 tablet by mouth daily.  Glucosuria -     POCT glycosylated hemoglobin (Hb A1C)  Dyslipidemia -     atorvastatin (LIPITOR) 40 MG tablet; Take 1 tablet (40 mg total) by mouth daily.  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  Controlled type 2 diabetes mellitus without complication, without long-term current use of insulin (HCC) -     metFORMIN (GLUCOPHAGE-XR) 500 MG 24 hr tablet; Take 3 tablets (1,500 mg total) by mouth daily with breakfast.  Other orders -     acetaminophen (TYLENOL) 500 MG tablet; Take 2 tablets (1,000 mg total) by mouth every 8 (eight) hours as needed  for mild pain, moderate pain or fever. Take 2 tabs every 8 hours.    The patient is advised to call or return to clinic if he does not see an improvement in symptoms, or to seek the care of the closest emergency department if he worsens with the above plan.   Philis Fendt, MHS, PA-C [facility] 01/09/2017 11:20 AM

## 2017-01-09 NOTE — Patient Instructions (Addendum)
I am changing you blood pressure medication to Prinzide.  I have sent this to the pharmacy.  Please start this today and I would like to see you back in about 1 month for a BP recheck.   Please restart you blood pressure, diabetes and cholesterol medication.    IF you received an x-ray today, you will receive an invoice from [facility]. Please contact [facility] at [phone] with questions or concerns regarding your invoice.   IF you received labwork today, you will receive an invoice from [facility]. Please contact [facility] at [phone] with questions or concerns regarding your invoice.   Our billing staff will not be able to assist you with questions regarding bills from these companies.  You will be contacted with the lab results as soon as they are available. The fastest way to get your results is to activate your My Chart account. Instructions are located on the last page of this paperwork. If you have not heard from us regarding the results in 2 weeks, please contact this office.

## 2017-01-10 LAB — CMP14+EGFR
ALT: 42 IU/L (ref 0–44)
AST: 32 IU/L (ref 0–40)
Albumin/Globulin Ratio: 1.5 (ref 1.2–2.2)
Albumin: 4.8 g/dL (ref 3.6–4.8)
Alkaline Phosphatase: 52 IU/L (ref 39–117)
BUN/Creatinine Ratio: 13 (ref 10–24)
BUN: 15 mg/dL (ref 8–27)
Bilirubin Total: 0.8 mg/dL (ref 0.0–1.2)
CO2: 21 mmol/L (ref 20–29)
Calcium: 10.1 mg/dL (ref 8.6–10.2)
Chloride: 100 mmol/L (ref 96–106)
Creatinine, Ser: 1.13 mg/dL (ref 0.76–1.27)
GFR calc Af Amer: 80 mL/min/{1.73_m2} (ref >59)
GFR calc non Af Amer: 69 mL/min/{1.73_m2} (ref >59)
Globulin, Total: 3.2 g/dL (ref 1.5–4.5)
Glucose: 200 mg/dL — ABNORMAL HIGH (ref 65–99)
Potassium: 4.4 mmol/L (ref 3.5–5.2)
Sodium: 139 mmol/L (ref 134–144)
Total Protein: 8 g/dL (ref 6.0–8.5)

## 2017-01-10 LAB — PSA: Prostate Specific Ag, Serum: 0.8 ng/mL (ref 0.0–4.0)

## 2017-01-12 NOTE — Progress Notes (Signed)
Letter please. I'd like to see him back in about 3 months for diabetes management.  Darren BostonMichael Rexanne Inocencio, MS, PA-C 2:52 PM, 01/12/2017

## 2017-01-13 ENCOUNTER — Encounter: Payer: Self-pay | Admitting: Radiology

## 2017-02-08 ENCOUNTER — Ambulatory Visit (INDEPENDENT_AMBULATORY_CARE_PROVIDER_SITE_OTHER): Payer: BLUE CROSS/BLUE SHIELD | Admitting: Physician Assistant

## 2017-02-08 ENCOUNTER — Encounter: Payer: Self-pay | Admitting: Physician Assistant

## 2017-02-08 VITALS — BP 102/62 | HR 95 | Temp 98.3°F | Resp 16 | Ht 72.5 in | Wt 236.4 lb

## 2017-02-08 DIAGNOSIS — Z23 Encounter for immunization: Secondary | ICD-10-CM | POA: Diagnosis not present

## 2017-02-08 DIAGNOSIS — Z794 Long term (current) use of insulin: Secondary | ICD-10-CM | POA: Diagnosis not present

## 2017-02-08 DIAGNOSIS — E119 Type 2 diabetes mellitus without complications: Secondary | ICD-10-CM

## 2017-02-08 DIAGNOSIS — Z1211 Encounter for screening for malignant neoplasm of colon: Secondary | ICD-10-CM | POA: Diagnosis not present

## 2017-02-08 NOTE — Progress Notes (Signed)
02/13/2017 11:30 AM   DOB: [birth year] / MRN: [id number]  SUBJECTIVE:  Darren Gutierrez is a 66 y.o. male presenting for recheck of urinary hesitancy.  Tells me that he feels better today.  Denies dysuria, urgency and frequency.    Current Outpatient Prescriptions:  .  acetaminophen (TYLENOL) 500 MG tablet, Take 2 tablets (1,000 mg total) by mouth every 8 (eight) hours as needed for mild pain, moderate pain or fever. Take 2 tabs every 8 hours., Disp: 90 tablet, Rfl: prn .  atorvastatin (LIPITOR) 40 MG tablet, Take 1 tablet (40 mg total) by mouth daily., Disp: 90 tablet, Rfl: 1 .  lisinopril-hydrochlorothiazide (PRINZIDE,ZESTORETIC) 20-25 MG tablet, Take 1 tablet by mouth daily., Disp: 90 tablet, Rfl: 3 .  metFORMIN (GLUCOPHAGE-XR) 500 MG 24 hr tablet, Take 3 tablets (1,500 mg total) by mouth daily with breakfast., Disp: 270 tablet, Rfl: 1   He has No Known Allergies.   He  has a past medical history of Diabetes mellitus without complication (HCC); Hyperlipidemia; Hypertension; and Subarachnoid cyst (12/09/2012).    He  reports that he quit smoking about 16 years ago. He has never used smokeless tobacco. He reports that he drinks alcohol. He reports that he does not use drugs. He  reports that he does not engage in sexual activity. The patient  has no past surgical history on file.  His family history includes Hypertension in his father and mother.  Review of Systems  Constitutional: Negative for chills, diaphoresis and fever.  Gastrointestinal: Negative for nausea.  Skin: Negative for rash.  Neurological: Negative for dizziness.    The problem list and medications were reviewed and updated by myself where necessary and exist elsewhere in the encounter.   OBJECTIVE:  BP 102/62 (BP Location: Right Arm, Patient Position: Sitting, Cuff Size: Large)   Pulse 95   Temp 98.3 F (36.8 C) (Oral)   Resp 16   Ht 6' 0.5" (1.842 m)   Wt 236 lb 6.4 oz (107.2 kg)   SpO2 96%   BMI 31.62  kg/m   Pulse Readings from Last 3 Encounters:  02/08/17 95  01/09/17 77  01/20/16 80   Physical Exam  Constitutional: He appears well-developed. He is active and cooperative.  Non-toxic appearance.  Cardiovascular: Normal rate, regular rhythm, S1 normal, S2 normal, normal heart sounds, intact distal pulses and normal pulses.  Exam reveals no gallop and no friction rub.   No murmur heard. Pulmonary/Chest: Effort normal. No stridor. No tachypnea. No respiratory distress. He has no wheezes. He has no rales.  Abdominal: He exhibits no distension.  Musculoskeletal: He exhibits no edema.  Neurological: He is alert.  Skin: Skin is warm and dry. He is not diaphoretic. No pallor.  Vitals reviewed.   Lab Results  Component Value Date   WBC 6.0 01/09/2017   HGB 18.0 01/09/2017   HCT 52.4 01/09/2017   MCV 93.9 01/09/2017    Lab Results  Component Value Date   CREATININE 1.13 01/09/2017   BUN 15 01/09/2017   NA 139 01/09/2017   K 4.4 01/09/2017   CL 100 01/09/2017   CO2 21 01/09/2017    Lab Results  Component Value Date   ALT 42 01/09/2017   AST 32 01/09/2017   ALKPHOS 52 01/09/2017   BILITOT 0.8 01/09/2017    Lab Results  Component Value Date   TSH 1.801 11/03/2014    Lab Results  Component Value Date   HGBA1C 8.0 01/09/2017    Lab Results  Component Value Date   CHOL 159 11/03/2014   HDL 49 11/03/2014   LDLCALC 92 11/03/2014   LDLDIRECT 121 (H) 07/12/2014   TRIG 92 11/03/2014   CHOLHDL 3.2 11/03/2014    No results found for this or any previous visit (from the past 72 hour(s)).  No results found.  ASSESSMENT AND PLAN:  Darren Gutierrez was seen today for follow-up.  Diagnoses and all orders for this visit:  Type 2 diabetes mellitus without complication, with long-term current use of insulin (HCC): Pressure improved.  He is back on his statin and metformin.  Will bring him back in January for recheck diabetes.  -     Cancel: Microalbumin, urine  Special  screening for malignant neoplasms, colon -     Cologuard  Need for prophylactic vaccination against Streptococcus pneumoniae (pneumococcus) -     Td : Tetanus/diphtheria >7yo Preservative  free  Need for prophylactic vaccination with tetanus-diphtheria (Td) -     Pneumococcal polysaccharide vaccine 23-valent greater than or equal to 2yo subcutaneous/IM    The patient is advised to call or return to clinic if he does not see an improvement in symptoms, or to seek the care of the closest emergency department if he worsens with the above plan.   Deliah Boston, MHS, PA-C [facility] at [facility] 02/13/2017 11:30 AM

## 2017-05-24 ENCOUNTER — Ambulatory Visit: Payer: Self-pay | Admitting: Physician Assistant

## 2017-09-09 ENCOUNTER — Other Ambulatory Visit: Payer: Self-pay | Admitting: Physician Assistant

## 2017-09-09 DIAGNOSIS — E785 Hyperlipidemia, unspecified: Secondary | ICD-10-CM

## 2017-09-09 DIAGNOSIS — E119 Type 2 diabetes mellitus without complications: Secondary | ICD-10-CM

## 2017-12-07 ENCOUNTER — Other Ambulatory Visit: Payer: Self-pay | Admitting: Physician Assistant

## 2017-12-07 DIAGNOSIS — E119 Type 2 diabetes mellitus without complications: Secondary | ICD-10-CM

## 2017-12-08 NOTE — Telephone Encounter (Signed)
Metformin refill Last Refill:09/10/17 # 90 Last OV: 02/08/17 PCP: Deliah BostonMichael Clark PA Pharmacy:[facility] [phone]  Last Hgb A1C: 01/09/17= 8.0  Called and left message for pt to make office visit for further refills. Refilled for 15 days of medication per protocol.

## 2017-12-09 ENCOUNTER — Other Ambulatory Visit: Payer: Self-pay | Admitting: Physician Assistant

## 2017-12-09 DIAGNOSIS — E785 Hyperlipidemia, unspecified: Secondary | ICD-10-CM

## 2018-03-21 IMAGING — DX DG LUMBAR SPINE COMPLETE 4+V
5 series · 5 of 5 positions shown · non-contrast
Comparison: None available

CLINICAL DATA: Acute left back pain with sciatica, unspecified

EXAM:
LUMBAR SPINE - COMPLETE 4+ VIEW

[l-spine ap]
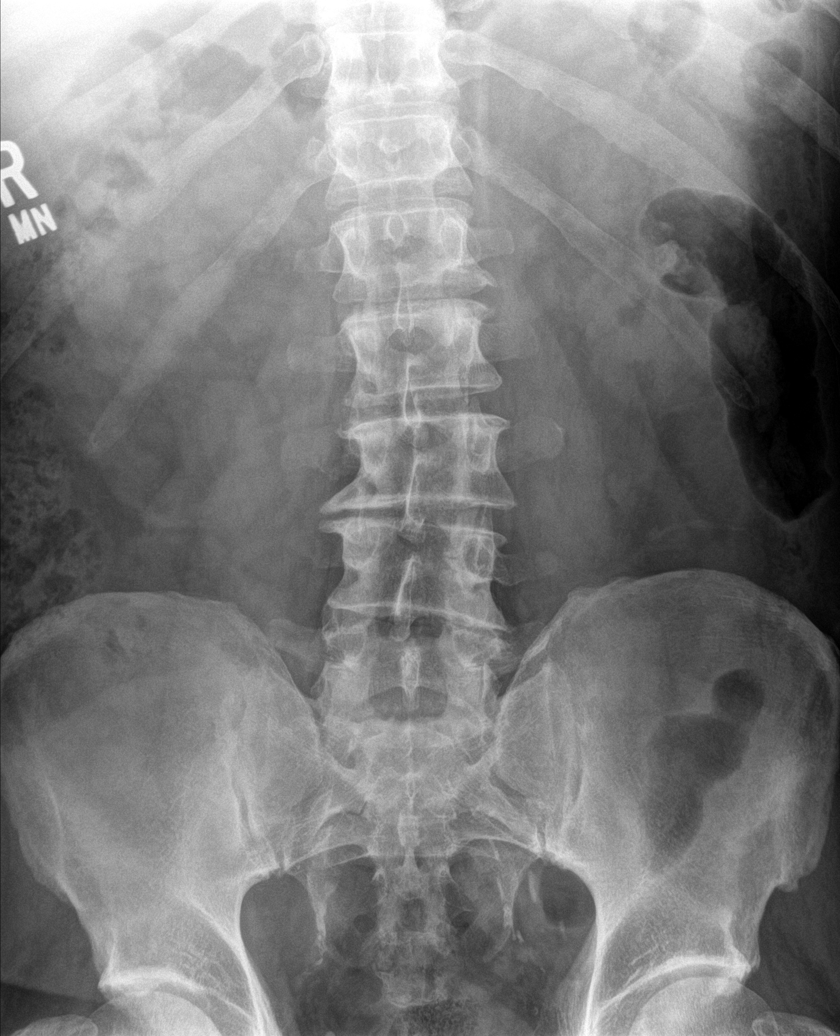

[l-spine obl (1 of 2)]
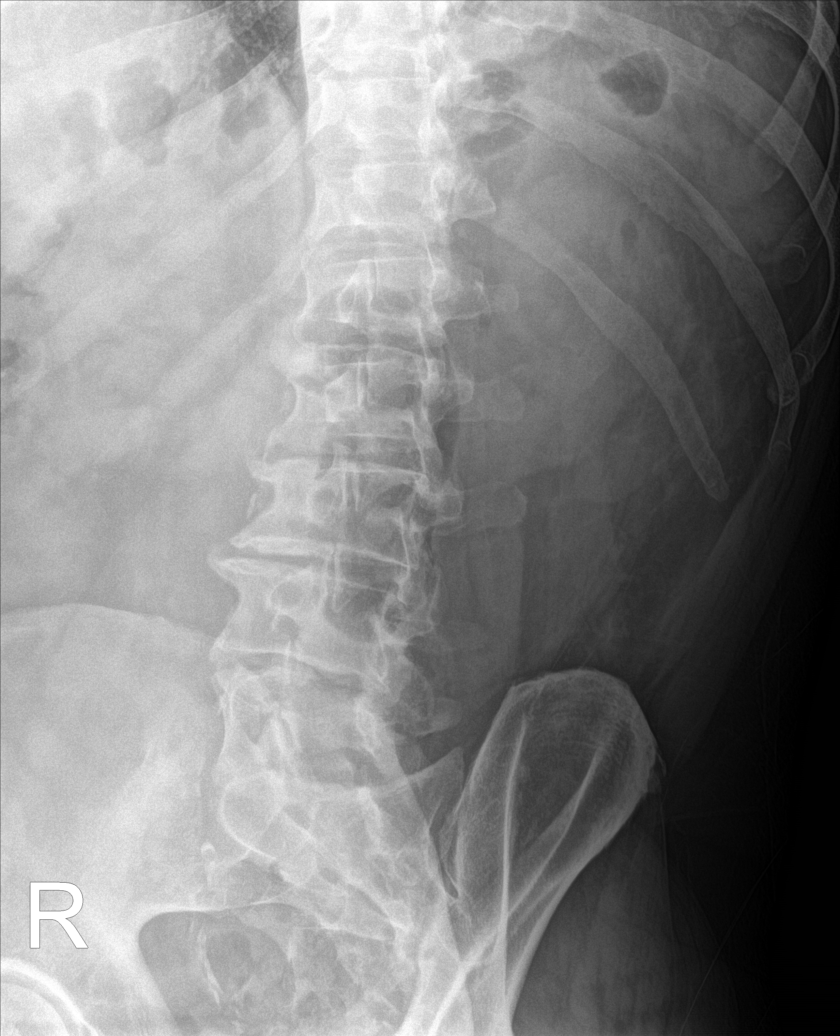

[l-spine obl (2 of 2)]
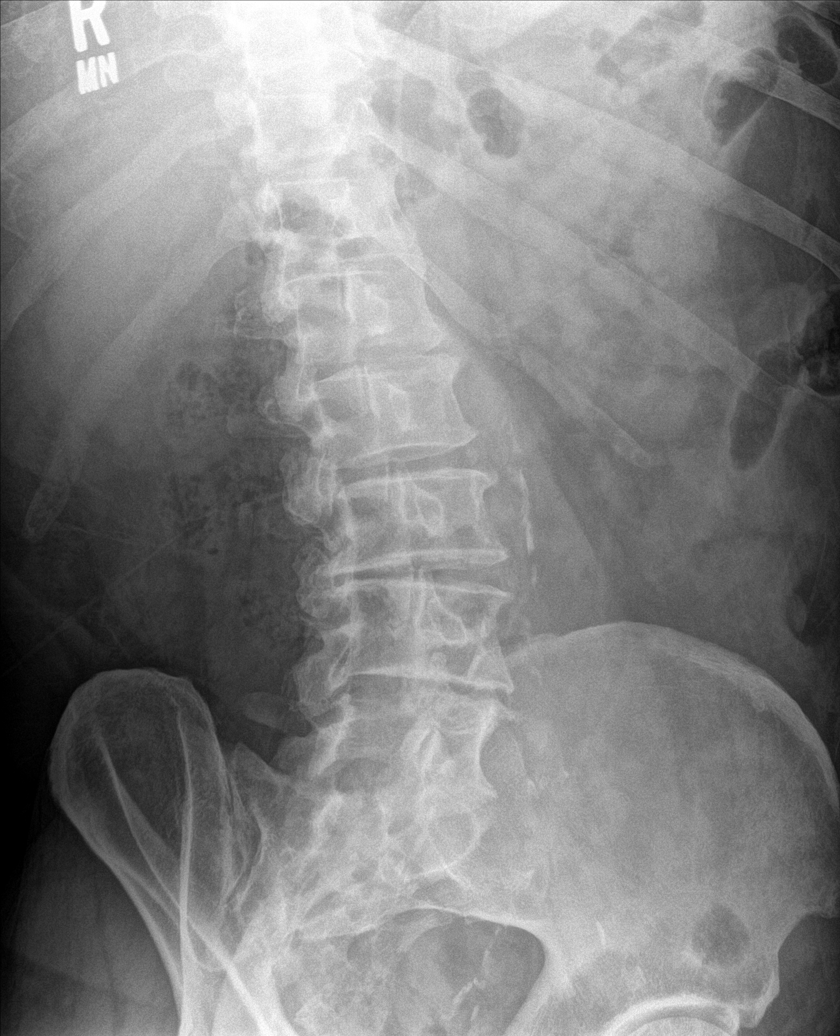

[l-spine lat]
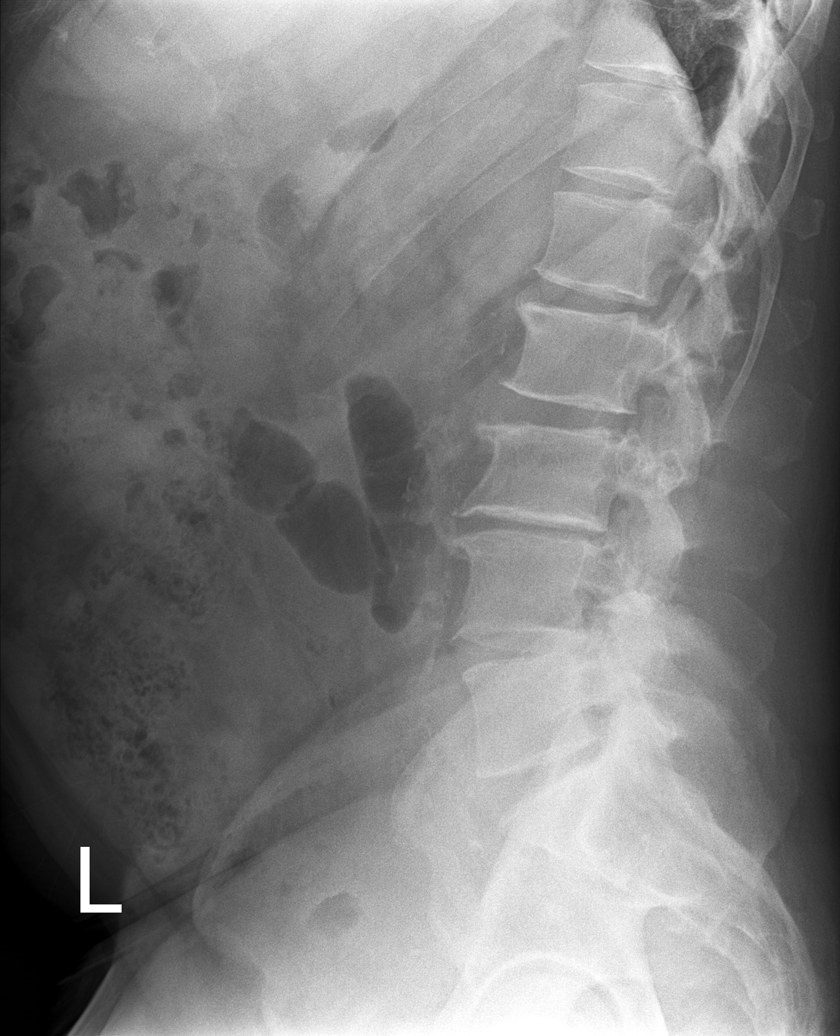

[l-spine l5-s1]
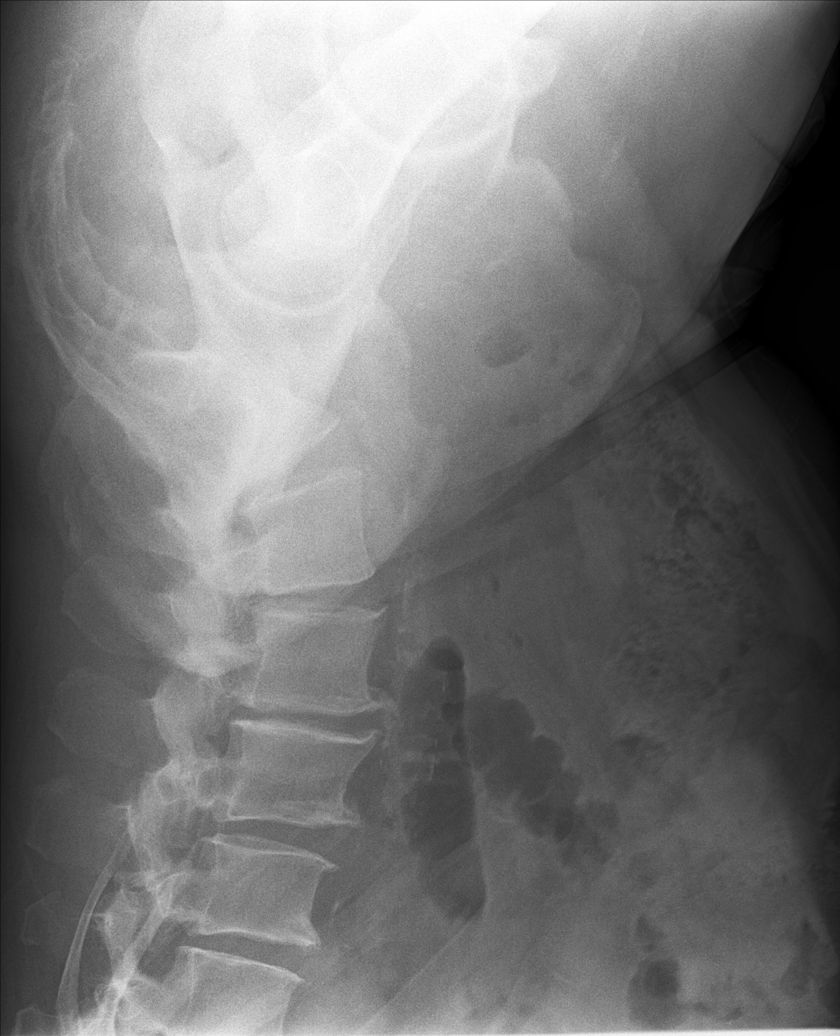

[5 of 5 positions shown; findings below may reference images not displayed]

FINDINGS: Lumbar degenerative spondylosis at multiple levels but most
pronounced at L3-4 with marked disc space narrowing, sclerosis and
endplate osteophytes. Lower lumbar facet arthropathy at L4-5 and
L5-S1. Preserved vertebral body heights. No acute compression
fracture or wedge-shaped deformity.

Aorta is atherosclerotic. No pars defects. Pedicles appear intact.
Minor associated levoscoliosis of the lumbar spine. Normal bowel gas
pattern.
IMPRESSION: Degenerative changes of the lumbar spine, most pronounced at L3-4 as
above. Associated mild levoscoliosis.

No acute finding by plain radiography

Atherosclerosis

## 2018-04-06 ENCOUNTER — Other Ambulatory Visit: Payer: Self-pay | Admitting: Physician Assistant

## 2018-04-06 DIAGNOSIS — I10 Essential (primary) hypertension: Secondary | ICD-10-CM

## 2018-04-06 DIAGNOSIS — E119 Type 2 diabetes mellitus without complications: Secondary | ICD-10-CM

## 2018-04-08 NOTE — Telephone Encounter (Signed)
Requested medication (s) are due for refill today: Yes  Requested medication (s) are on the active medication list: Yes  Last refill:  01/09/17  Future visit scheduled: No  Notes to clinic:  Message left to call and schedule OV.    Requested Prescriptions  Pending Prescriptions Disp Refills   lisinopril-hydrochlorothiazide (PRINZIDE,ZESTORETIC) 20-25 MG tablet [Pharmacy Med Name: LISINOPRIL/HCTZ 20-25MG TAB] 90 tablet 3    Sig: TAKE 1 TABLET BY MOUTH ONCE DAILY     Cardiovascular:  ACEI + Diuretic Combos Failed - 04/06/2018 11:30 AM      Failed - Na in normal range and within 180 days    Sodium  Date Value Ref Range Status  01/09/2017 139 134 - 144 mmol/L Final         Failed - K in normal range and within 180 days    Potassium  Date Value Ref Range Status  01/09/2017 4.4 3.5 - 5.2 mmol/L Final         Failed - Cr in normal range and within 180 days    Creat  Date Value Ref Range Status  11/03/2014 0.98 0.50 - 1.35 mg/dL Final   Creatinine, Ser  Date Value Ref Range Status  01/09/2017 1.13 0.76 - 1.27 mg/dL Final         Failed - Ca in normal range and within 180 days    Calcium  Date Value Ref Range Status  01/09/2017 10.1 8.6 - 10.2 mg/dL Final         Failed - Valid encounter within last 6 months    Recent Outpatient Visits          1 year ago Type 2 diabetes mellitus without complication, with long-term current use of insulin ([place])   [facility] [place], PA-C   1 year ago Urinary hesitancy   [facility] at [place], PA-C   2 years ago Right hand pain   [facility] [place], PA-C   3 years ago Idiopathic chronic gout of left ankle without tophus   [facility], Laurey Arrow, MD   3 years ago Essential hypertension, benign   [facility] at [facility], Thao P, DO             Passed - Patient is not pregnant      Passed - Last BP in normal range    BP Readings from Last 1 Encounters:   02/08/17 102/62        metFORMIN (GLUCOPHAGE-XR) 500 MG 24 hr tablet [Pharmacy Med Name: METFORMIN ER 500MG  TAB] 270 tablet 1    Sig: TAKE 3 TABLETS BY MOUTH ONCE DAILY WITH BREAKFAST     Endocrinology:  Diabetes - Biguanides Failed - 04/06/2018 11:30 AM      Failed - Cr in normal range and within 360 days    Creat  Date Value Ref Range Status  11/03/2014 0.98 0.50 - 1.35 mg/dL Final   Creatinine, Ser  Date Value Ref Range Status  01/09/2017 1.13 0.76 - 1.27 mg/dL Final         Failed - HBA1C is between 0 and 7.9 and within 180 days    Hemoglobin A1C  Date Value Ref Range Status  01/09/2017 8.0  Final         Failed - eGFR in normal range and within 360 days    GFR, Est African American  Date Value Ref Range Status  07/12/2014 87 mL/min Final   GFR calc Af Amer  Date Value Ref Range Status  01/09/2017 80 >59 mL/min/1.73 Final   GFR, Est Non African American  Date Value Ref Range Status  07/12/2014 75 mL/min Final    Comment:      The estimated GFR is a calculation valid for adults (>=59 years old) that uses the CKD-EPI algorithm to adjust for age and sex. It is   not to be used for children, pregnant women, hospitalized patients,    patients on dialysis, or with rapidly changing kidney function. According to the NKDEP, eGFR >89 is normal, 60-89 shows mild impairment, 30-59 shows moderate impairment, 15-29 shows severe impairment and <15 is ESRD.      GFR calc non Af Amer  Date Value Ref Range Status  01/09/2017 69 >59 mL/min/1.73 Final         Failed - Valid encounter within last 6 months    Recent Outpatient Visits          1 year ago Type 2 diabetes mellitus without complication, with long-term current use of insulin ([place])   [facility] at [place], PA-C   1 year ago Urinary hesitancy   [facility] at [place], PA-C   2 years ago Right hand pain   [facility] at [place], PA-C   3 years ago Idiopathic  chronic gout of left ankle without tophus   [facility], Laurey Arrow, MD   3 years ago Essential hypertension, benign   Primary [facility] at [facility], [place], DO

## 2018-05-12 ENCOUNTER — Ambulatory Visit: Payer: Self-pay | Admitting: Family Medicine

## 2018-07-27 ENCOUNTER — Other Ambulatory Visit: Payer: Self-pay | Admitting: Physician Assistant

## 2018-07-27 DIAGNOSIS — E119 Type 2 diabetes mellitus without complications: Secondary | ICD-10-CM

## 2018-09-01 ENCOUNTER — Other Ambulatory Visit: Payer: Self-pay | Admitting: Physician Assistant

## 2018-09-01 DIAGNOSIS — E119 Type 2 diabetes mellitus without complications: Secondary | ICD-10-CM

## 2024-01-04 ENCOUNTER — Emergency Department (HOSPITAL_BASED_OUTPATIENT_CLINIC_OR_DEPARTMENT_OTHER)

## 2024-01-04 ENCOUNTER — Other Ambulatory Visit: Payer: Self-pay

## 2024-01-04 ENCOUNTER — Inpatient Hospital Stay (HOSPITAL_BASED_OUTPATIENT_CLINIC_OR_DEPARTMENT_OTHER)
Admission: EM | Admit: 2024-01-04 | Discharge: 2024-01-07 | DRG: 309 | Disposition: A | Discharge status: Home / Self Care | Attending: Internal Medicine | Admitting: Internal Medicine

## 2024-01-04 ENCOUNTER — Encounter (HOSPITAL_BASED_OUTPATIENT_CLINIC_OR_DEPARTMENT_OTHER): Payer: Self-pay

## 2024-01-04 DIAGNOSIS — Z8249 Family history of ischemic heart disease and other diseases of the circulatory system: Secondary | ICD-10-CM | POA: Diagnosis not present

## 2024-01-04 DIAGNOSIS — Z87891 Personal history of nicotine dependence: Secondary | ICD-10-CM | POA: Diagnosis not present

## 2024-01-04 DIAGNOSIS — M109 Gout, unspecified: Secondary | ICD-10-CM | POA: Diagnosis present

## 2024-01-04 DIAGNOSIS — I2510 Atherosclerotic heart disease of native coronary artery without angina pectoris: Secondary | ICD-10-CM | POA: Diagnosis present

## 2024-01-04 DIAGNOSIS — R000 Tachycardia, unspecified: Secondary | ICD-10-CM | POA: Diagnosis not present

## 2024-01-04 DIAGNOSIS — R109 Unspecified abdominal pain: Secondary | ICD-10-CM | POA: Insufficient documentation

## 2024-01-04 DIAGNOSIS — R7989 Other specified abnormal findings of blood chemistry: Secondary | ICD-10-CM | POA: Diagnosis present

## 2024-01-04 DIAGNOSIS — F1010 Alcohol abuse, uncomplicated: Secondary | ICD-10-CM | POA: Diagnosis present

## 2024-01-04 DIAGNOSIS — I10 Essential (primary) hypertension: Secondary | ICD-10-CM | POA: Diagnosis present

## 2024-01-04 DIAGNOSIS — I4891 Unspecified atrial fibrillation: Principal | ICD-10-CM | POA: Diagnosis present

## 2024-01-04 DIAGNOSIS — Z794 Long term (current) use of insulin: Secondary | ICD-10-CM

## 2024-01-04 DIAGNOSIS — R0902 Hypoxemia: Secondary | ICD-10-CM | POA: Diagnosis not present

## 2024-01-04 DIAGNOSIS — N179 Acute kidney failure, unspecified: Secondary | ICD-10-CM | POA: Diagnosis present

## 2024-01-04 DIAGNOSIS — Z79899 Other long term (current) drug therapy: Secondary | ICD-10-CM | POA: Diagnosis not present

## 2024-01-04 DIAGNOSIS — E860 Dehydration: Secondary | ICD-10-CM | POA: Diagnosis present

## 2024-01-04 DIAGNOSIS — R1013 Epigastric pain: Secondary | ICD-10-CM | POA: Diagnosis present

## 2024-01-04 DIAGNOSIS — I700 Atherosclerosis of aorta: Secondary | ICD-10-CM | POA: Diagnosis present

## 2024-01-04 DIAGNOSIS — Z743 Need for continuous supervision: Secondary | ICD-10-CM | POA: Diagnosis not present

## 2024-01-04 DIAGNOSIS — Z7984 Long term (current) use of oral hypoglycemic drugs: Secondary | ICD-10-CM | POA: Diagnosis not present

## 2024-01-04 DIAGNOSIS — E119 Type 2 diabetes mellitus without complications: Secondary | ICD-10-CM | POA: Diagnosis present

## 2024-01-04 DIAGNOSIS — E785 Hyperlipidemia, unspecified: Secondary | ICD-10-CM | POA: Diagnosis not present

## 2024-01-04 DIAGNOSIS — I4892 Unspecified atrial flutter: Principal | ICD-10-CM

## 2024-01-04 DIAGNOSIS — E7800 Pure hypercholesterolemia, unspecified: Secondary | ICD-10-CM | POA: Diagnosis present

## 2024-01-04 DIAGNOSIS — E872 Acidosis, unspecified: Secondary | ICD-10-CM | POA: Diagnosis present

## 2024-01-04 DIAGNOSIS — R531 Weakness: Secondary | ICD-10-CM | POA: Diagnosis not present

## 2024-01-04 LAB — COMPREHENSIVE METABOLIC PANEL WITH GFR
ALT: 36 U/L (ref 0–44)
AST: 35 U/L (ref 15–41)
Albumin: 4.4 g/dL (ref 3.5–5.0)
Alkaline Phosphatase: 54 U/L (ref 38–126)
Anion gap: 16 — ABNORMAL HIGH (ref 5–15)
BUN: 16 mg/dL (ref 8–23)
CO2: 23 mmol/L (ref 22–32)
Calcium: 10 mg/dL (ref 8.9–10.3)
Chloride: 101 mmol/L (ref 98–111)
Creatinine, Ser: 1.36 mg/dL — ABNORMAL HIGH (ref 0.61–1.24)
GFR, Estimated: 56 mL/min — ABNORMAL LOW (ref >60)
Glucose, Bld: 106 mg/dL — ABNORMAL HIGH (ref 70–99)
Potassium: 4.4 mmol/L (ref 3.5–5.1)
Sodium: 140 mmol/L (ref 135–145)
Total Bilirubin: 0.4 mg/dL (ref 0.0–1.2)
Total Protein: 7.4 g/dL (ref 6.5–8.1)

## 2024-01-04 LAB — CBC
HCT: 39.3 % (ref 39.0–52.0)
Hemoglobin: 13.7 g/dL (ref 13.0–17.0)
MCH: 32.7 pg (ref 26.0–34.0)
MCHC: 34.9 g/dL (ref 30.0–36.0)
MCV: 93.8 fL (ref 80.0–100.0)
Platelets: 232 K/uL (ref 150–400)
RBC: 4.19 MIL/uL — ABNORMAL LOW (ref 4.22–5.81)
RDW: 12.6 % (ref 11.5–15.5)
WBC: 5.5 K/uL (ref 4.0–10.5)
nRBC: 0 % (ref 0.0–0.2)

## 2024-01-04 LAB — TROPONIN T, HIGH SENSITIVITY
Troponin T High Sensitivity: 26 ng/L — ABNORMAL HIGH (ref 0–19)
Troponin T High Sensitivity: 27 ng/L — ABNORMAL HIGH (ref 0–19)

## 2024-01-04 LAB — CBG MONITORING, ED: Glucose-Capillary: 108 mg/dL — ABNORMAL HIGH (ref 70–99)

## 2024-01-04 LAB — LACTIC ACID, PLASMA
Lactic Acid, Venous: 2.6 mmol/L (ref 0.5–1.9)
Lactic Acid, Venous: 1.7 mmol/L (ref 0.5–1.9)

## 2024-01-04 LAB — T4, FREE: Free T4: 0.74 ng/dL (ref 0.61–1.12)

## 2024-01-04 LAB — LIPASE, BLOOD: Lipase: 55 U/L — ABNORMAL HIGH (ref 11–51)

## 2024-01-04 LAB — TSH: TSH: 2.5 u[IU]/mL (ref 0.350–4.500)

## 2024-01-04 MED ORDER — LACTATED RINGERS IV BOLUS: 500.0000 mL | Freq: Once | INTRAVENOUS | Status: DC

## 2024-01-04 MED ORDER — SODIUM CHLORIDE 0.9 % IV BOLUS
1000.0000 mL | Freq: Once | INTRAVENOUS | Status: AC
  Administered 2024-01-04: 1000 mL via INTRAVENOUS

## 2024-01-04 MED ORDER — LORAZEPAM 1 MG PO TABS: 1.0000 mg | ORAL_TABLET | ORAL | Status: DC | PRN

## 2024-01-04 MED ORDER — DILTIAZEM LOAD VIA INFUSION
10.0000 mg | Freq: Once | INTRAVENOUS | Status: AC
  Administered 2024-01-04: 10 mg via INTRAVENOUS
  Filled 2024-01-04: qty 10

## 2024-01-04 MED ORDER — DILTIAZEM HCL-DEXTROSE 125-5 MG/125ML-% IV SOLN (PREMIX)
5.0000 mg/h | INTRAVENOUS | Status: DC
  Administered 2024-01-04: 5 mg/h via INTRAVENOUS
  Filled 2024-01-04: qty 125

## 2024-01-04 MED ORDER — ACETAMINOPHEN 325 MG PO TABS: 650.0000 mg | ORAL_TABLET | Freq: Four times a day (QID) | ORAL | Status: DC | PRN

## 2024-01-04 MED ORDER — MORPHINE SULFATE (PF) 4 MG/ML IV SOLN
4.0000 mg | Freq: Once | INTRAVENOUS | Status: AC
  Administered 2024-01-04: 4 mg via INTRAVENOUS
  Filled 2024-01-04: qty 1

## 2024-01-04 MED ORDER — ADULT MULTIVITAMIN W/MINERALS CH: 1.0000 | ORAL_TABLET | Freq: Every day | ORAL | Status: DC

## 2024-01-04 MED ORDER — DILTIAZEM HCL 25 MG/5ML IV SOLN
10.0000 mg | Freq: Once | INTRAVENOUS | Status: AC
  Administered 2024-01-04: 10 mg via INTRAVENOUS
  Filled 2024-01-04: qty 5

## 2024-01-04 MED ORDER — APIXABAN 5 MG PO TABS
5.0000 mg | ORAL_TABLET | Freq: Two times a day (BID) | ORAL | Status: DC
  Administered 2024-01-04: 5 mg via ORAL
  Filled 2024-01-04: qty 1

## 2024-01-04 MED ORDER — THIAMINE HCL 100 MG/ML IJ SOLN: 100.0000 mg | Freq: Every day | INTRAMUSCULAR | Status: DC

## 2024-01-04 MED ORDER — THIAMINE MONONITRATE 100 MG PO TABS
100.0000 mg | ORAL_TABLET | Freq: Every day | ORAL | Status: DC
  Administered 2024-01-06 – 2024-01-07 (×2): 100 mg via ORAL
  Filled 2024-01-04 (×3): qty 1

## 2024-01-04 MED ORDER — INSULIN ASPART 100 UNIT/ML IJ SOLN: 0.0000 [IU] | Freq: Three times a day (TID) | INTRAMUSCULAR | Status: DC

## 2024-01-04 MED ORDER — ACETAMINOPHEN 650 MG RE SUPP: 650.0000 mg | Freq: Four times a day (QID) | RECTAL | Status: DC | PRN

## 2024-01-04 MED ORDER — ONDANSETRON HCL 4 MG/2ML IJ SOLN
4.0000 mg | Freq: Once | INTRAMUSCULAR | Status: AC
  Administered 2024-01-04: 4 mg via INTRAVENOUS
  Filled 2024-01-04: qty 2

## 2024-01-04 MED ORDER — FOLIC ACID 1 MG PO TABS
1.0000 mg | ORAL_TABLET | Freq: Every day | ORAL | Status: DC
  Administered 2024-01-05 – 2024-01-07 (×3): 1 mg via ORAL
  Filled 2024-01-04 (×3): qty 1

## 2024-01-04 MED ORDER — LORAZEPAM 2 MG/ML IJ SOLN: 1.0000 mg | INTRAMUSCULAR | Status: DC | PRN

## 2024-01-04 MED ORDER — ATORVASTATIN CALCIUM 40 MG PO TABS
40.0000 mg | ORAL_TABLET | Freq: Every day | ORAL | Status: DC
  Administered 2024-01-05 – 2024-01-07 (×3): 40 mg via ORAL
  Filled 2024-01-04 (×3): qty 1

## 2024-01-04 MED ORDER — IOHEXOL 350 MG/ML SOLN
100.0000 mL | Freq: Once | INTRAVENOUS | Status: AC | PRN
  Administered 2024-01-04: 100 mL via INTRAVENOUS

## 2024-01-04 NOTE — ED Notes (Signed)
 Nebo Robicheaux- pt's son  [phone]

## 2024-01-04 NOTE — ED Notes (Signed)
 SpO2 89-91% placed on 2LPM [place], denies SHOB.

## 2024-01-04 NOTE — ED Notes (Signed)
 Pt unable to provide UA. Aware a specimen is needed.

## 2024-01-04 NOTE — ED Triage Notes (Signed)
 Patient here POV from [facility].  Darren Gutierrez to clinic to have mid abd pain that he has been having for 3 weeks assessed. Sent for evaluation because HR was out of control. Some N/V that has subsided. Some Diarrhea as well. No Known Fevers.   NAD noted during Triage. A&Ox4. Gcs 15. Ambulatory.

## 2024-01-04 NOTE — ED Provider Notes (Signed)
 [facility] Provider Note   CSN: [id number] Arrival date & time: 01/04/24  1350     Patient presents with: Abdominal Pain   Burnham Girardot is a 73 y.o. male with past medical history sniffer diabetes, hyperlipidemia who presents concern for mid abdominal pain, nausea, vomiting, diarrhea for around 3 weeks.  Was sent by [facility] medical for assessment after significant tachycardia.  He arrives with heart rate 135.  No previous history of similar.  He denies any chest pain, shortness of breath.  He denies any fever, chills, dysuria.  No previous history of abdominal surgery.  Reports he is compliant with his blood pressure, diabetes medications.    Abdominal Pain      Prior to Admission medications   Medication Sig Start Date End Date Taking? Authorizing Provider  atorvastatin  (LIPITOR) 40 MG tablet TAKE 1 TABLET BY MOUTH ONCE DAILY 09/10/17   Gretta Ozell CROME, PA-C  lisinopril -hydrochlorothiazide  (PRINZIDE ,ZESTORETIC ) 20-25 MG tablet Take 1 tablet by mouth daily. 01/09/17   Gretta Ozell CROME, PA-C  metFORMIN  (GLUCOPHAGE -XR) 500 MG 24 hr tablet TAKE 3 TABLETS BY MOUTH ONCE DAILY WITH BREAKFAST 09/10/17   Gretta Ozell CROME, PA-C  metFORMIN  (GLUCOPHAGE -XR) 500 MG 24 hr tablet TAKE 3 TABLETS BY MOUTH ONCE DAILY WITH BREAKFAST 12/08/17   Clark, Michael L, PA-C    Allergies: Patient has no known allergies.    Review of Systems  Gastrointestinal:  Positive for abdominal pain.  All other systems reviewed and are negative.   Updated Vital Signs BP (!) 141/111   Pulse (!) 135   Temp 97.7 F (36.5 C) (Oral)   Resp 13   Ht 6' (1.829 m)   Wt 94 kg   SpO2 94%   BMI 28.11 kg/m   Physical Exam Vitals and nursing note reviewed.  Constitutional:      General: He is not in acute distress.    Appearance: Normal appearance.  HENT:     Head: Normocephalic and atraumatic.  Eyes:     General:        Right eye: No discharge.        Left eye: No  discharge.  Cardiovascular:     Rate and Rhythm: Normal rate and regular rhythm.     Heart sounds: No murmur heard.    No friction rub. No gallop.  Pulmonary:     Effort: Pulmonary effort is normal.     Breath sounds: Normal breath sounds.  Abdominal:     General: Bowel sounds are normal.     Palpations: Abdomen is soft.     Comments: Focal tenderness to palpation without rebound, rigidity, guarding in the central and lower abdomen.  No focal McBurney's point ttp.  Skin:    General: Skin is warm and dry.     Capillary Refill: Capillary refill takes less than 2 seconds.  Neurological:     Mental Status: He is alert and oriented to person, place, and time.  Psychiatric:        Mood and Affect: Mood normal.        Behavior: Behavior normal.     (all labs ordered are listed, but only abnormal results are displayed) Labs Reviewed  LIPASE, BLOOD  COMPREHENSIVE METABOLIC PANEL WITH GFR  CBC  URINALYSIS, ROUTINE W REFLEX MICROSCOPIC  TROPONIN T, HIGH SENSITIVITY    EKG: EKG Interpretation Date/Time:  Tuesday January 04 2024 14:06:19 EDT Ventricular Rate:  135 PR Interval:  75 QRS Duration:  101  QT Interval:  356 QTC Calculation: 534 R Axis:   -24  Text Interpretation: Sinus or ectopic atrial tachycardia Probable inferior infarct, old Prolonged QT interval Confirmed by Cottie Cough [id number]) on 01/04/2024 2:13:30 PM  Radiology: No results found.   Procedures   Medications Ordered in the ED - No data to display                                  Medical Decision Making Amount and/or Complexity of Data Reviewed Labs: ordered. Radiology: ordered.  Risk Prescription drug management. Decision regarding hospitalization.   This patient is a 73 y.o. male  who presents to the ED for concern of abd pain, elevated heart rate.   Differential diagnoses prior to evaluation: The emergent differential diagnosis includes, but is not limited to,  The causes of generalized  abdominal pain include but are not limited to AAA, mesenteric ischemia, appendicitis, diverticulitis, DKA, gastritis, gastroenteritis, AMI, nephrolithiasis, pancreatitis, peritonitis, adrenal insufficiency,lead poisoning, iron toxicity, intestinal ischemia, constipation, UTI,SBO/LBO, splenic rupture, biliary disease, IBD, IBS, PUD, or hepatitis, The differential diagnosis for palpitations includes cardiac arrhythmias, PVC/PAC, ACS, Cardiomyopathy, CHF, MVP, pericarditis, valvular disease, Panic/Anxiety, Somatic disorder, ETOH, Caffeine,  Stimulant use, medication side effect, Anemia, Hyperthyroidism, pulmonary embolism. . This is not an exhaustive differential.   Past Medical History / Co-morbidities / Social History: HTN, HLD, DM  Physical Exam: Physical exam performed. The pertinent findings include: Tachycardia, rate of around 135 on arrival.  On reassessment after fluids, diltiazem  patient has an irregularly irregular rhythm but tachycardia has resolved.  Borderline hypoxia, around 92 to 93%, placed on 2 L nasal cannula.  Somewhat hypertensive, blood pressure 143/119 on arrival.  Mild tachypnea, respirations around low 20s other than 1 documented respiration of 28.  Lab Tests/Imaging studies: I personally interpreted labs/imaging and the pertinent results include: Lactic acid mildly elevated 2.6, his lipase is mildly elevated at 55.  CMP with elevated creatinine at 1.36, anion gap of 16.  Otherwise unremarkable, CBC unremarkable, troponin notable for mild elevation at 26.  Delta flat.  I independently interpreted plain film chest x-ray which shows probable left lung base small pleural effusion, mild atelectasis, no focal consolidation noted.  Mild dilation of ascending aorta, small probable scar or lesion noted in the lingula, no other acute intrathoracic or intra-abdominal abnormality noted.  I agree with the radiologist interpretation.  Cardiac monitoring: EKG obtained and interpreted by myself and  attending physician which shows: Initially ectopic tachycardia versus a flutter with rapid rate, on reassessment seems to be in A-fib with normal rate, shortly after diltiazem  bolus patient had return of accelerated rhythm, heart rate around 126, 127, will begin Cardizem  drip, plan for hospital admission after medical workup.   Medications: I ordered medication including Cardizem , morphine , fluid bolus, Zofran .  I have reviewed the patients home medicines and have made adjustments as needed.   Consults: Spoke with cardiology who agrees to consult during his admission, agrees with plan for diltiazem  drip after no rate control with single bolus.  Will begin anticoagulation.  Disposition: After consideration of the diagnostic results and the patients response to treatment, I feel that patient would benefit from admission for new a flutter with rapid ventricular rate.    Final diagnoses:  None    ED Discharge Orders     None          Rosan Sherlean VEAR DEVONNA 01/04/24 1812    Trifan,  Donnice PARAS, MD 01/06/24 [phone]

## 2024-01-04 NOTE — H&P (Signed)
 History and Physical    [name]:[id number] DOB: April 30, 1951 DOA: 01/04/2024  Patient coming from: Home.  Chief Complaint: Tachycardia.  Speaks [place] language.  Patient's son provided the translation.  HPI: Darren Gutierrez is a 73 y.o. male with history of diabetes mellitus type 2, hypertension, hyperlipidemia has been experiencing abdominal pain mostly in the epigastric area for the last 2 to 3 weeks.  Has had some nausea denies any vomiting.  Follows with [facility] and had followed up with them yesterday for abdominal pain and patient was found to be tachycardic was referred to ER at [facility].  Patient admits to drinking wine 1 bottle every day.  ED Course: In the ER patient is found to be in A-fib with RVR.  Was given Cardizem  bolus despite which patient was still tachycardic started on Cardizem  infusion.  CT PE study and abdomen pelvis was unremarkable.  Labs show mildly elevated troponin but flat.  Lipase mildly elevated at 55 LFTs were normal.  Creatinine 1.3 no old 1 to compare.  Lactic acid was elevated was given fluid bolus following which it improved.  Patient admitted for A-fib with RVR.  Review of Systems: As per HPI, rest all negative.   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    History reviewed. No pertinent surgical history.   reports that he quit smoking about 23 years ago. His smoking use included cigarettes. He has never used smokeless tobacco. He reports current alcohol use. He reports that he does not use drugs.  No Known Allergies  Family History  Problem Relation Age of Onset   Hypertension Mother    Hypertension Father     Prior to Admission medications   Medication Sig Start Date End Date Taking?  Authorizing Provider  atorvastatin  (LIPITOR) 40 MG tablet TAKE 1 TABLET BY MOUTH ONCE DAILY 09/10/17  Yes Gretta Ozell CROME, PA-C  CHOLECALCIFEROL PO Take 1 tablet by mouth daily. Dosage unknown   Yes [provider]  glipiZIDE (GLUCOTROL XL) 2.5 MG 24 hr tablet Take 2.5 mg by mouth daily with breakfast. 05/21/19  Yes [provider]  lisinopril -hydrochlorothiazide  (PRINZIDE ,ZESTORETIC ) 20-25 MG tablet Take 1 tablet by mouth daily. 01/09/17  Yes Gretta Ozell CROME, PA-C  meloxicam  (MOBIC ) 15 MG tablet Take 15-30 mg by mouth daily as needed. 11/25/23  Yes [provider]  metFORMIN  (GLUCOPHAGE ) 1000 MG tablet Take 1,000 mg by mouth 2 (two) times daily with a meal.   Yes [provider]    Physical Exam: Constitutional: Moderately built and nourished. Vitals:   01/04/24 1953 01/04/24 2000 01/04/24 2102 01/04/24 2317  BP:  (!) 148/99 (!) 159/114 125/85  Pulse: (!) 113 (!) 123 (!) 120 85  Resp: (!) 25 (!) 23 20 20   Temp:   97.9 F (36.6 C) 98 F (36.7 C)  TempSrc:   Oral Oral  SpO2: 97% 97% 95% 96%  Weight:      Height:       Eyes: Anicteric no pallor. ENMT: No discharge from the ears eyes nose or mouth. Neck: No mass felt.  No neck rigidity. Respiratory: No rhonchi or crepitations. Cardiovascular: S1-S2 heard. Abdomen: Soft nontender bowel sound present. Musculoskeletal: No edema. Skin: No rash. Neurologic: Alert awake oriented to time place and  person.  Moves all extremities. Psychiatric: Appears normal.  Normal affect.   Labs on Admission: I have personally reviewed following labs and imaging studies  CBC: Recent Labs  Lab 01/04/24 1440  WBC 5.5  HGB 13.7  HCT 39.3  MCV 93.8  PLT 232   Basic Metabolic Panel: Recent Labs  Lab 01/04/24 1440  NA 140  K 4.4  CL 101  CO2 23  GLUCOSE 106*  BUN 16  CREATININE 1.36*  CALCIUM  10.0   GFR: Estimated Creatinine Clearance: 60.2 mL/min (A) (by C-G formula based on SCr of 1.36 mg/dL  (H)). Liver Function Tests: Recent Labs  Lab 01/04/24 1440  AST 35  ALT 36  ALKPHOS 54  BILITOT 0.4  PROT 7.4  ALBUMIN 4.4   Recent Labs  Lab 01/04/24 1440  LIPASE 55*   No results for input(s): AMMONIA in the last 168 hours. Coagulation Profile: No results for input(s): INR, PROTIME in the last 168 hours. Cardiac Enzymes: No results for input(s): CKTOTAL, CKMB, CKMBINDEX, TROPONINI in the last 168 hours. BNP (last 3 results) No results for input(s): PROBNP in the last 8760 hours. HbA1C: No results for input(s): HGBA1C in the last 72 hours. CBG: Recent Labs  Lab 01/04/24 1443  GLUCAP 108*   Lipid Profile: No results for input(s): CHOL, HDL, LDLCALC, TRIG, CHOLHDL, LDLDIRECT in the last 72 hours. Thyroid  Function Tests: Recent Labs    01/04/24 1519  TSH 2.500  FREET4 0.74   Anemia Panel: No results for input(s): VITAMINB12, FOLATE, FERRITIN, TIBC, IRON, RETICCTPCT in the last 72 hours. Urine analysis:    Component Value Date/Time   BILIRUBINUR negative 01/09/2017 1018   BILIRUBINUR small 08/19/2013 1323   KETONESUR small (15) (A) 01/09/2017 1018   PROTEINUR negative 01/09/2017 1018   PROTEINUR 30 08/19/2013 1323   UROBILINOGEN 0.2 01/09/2017 1018   NITRITE Negative 01/09/2017 1018   NITRITE neg 08/19/2013 1323   LEUKOCYTESUR Negative 01/09/2017 1018   Sepsis Labs: @LABRCNTIP (procalcitonin:4,lacticidven:4) )No results found for this or any previous visit (from the past 240 hours).   Radiological Exams on Admission: CT ABDOMEN PELVIS W CONTRAST Result Date: 01/04/2024 CLINICAL DATA:  Pulmonary embolism (PE) suspected, high prob; Abdominal pain, acute, nonlocalized. Nausea/vomiting and diarrhea. EXAM: CT ANGIOGRAPHY CHEST CT ABDOMEN AND PELVIS WITH CONTRAST TECHNIQUE: Multidetector CT imaging of the chest was performed using the standard protocol during bolus administration of intravenous contrast. Multiplanar CT  image reconstructions and MIPs were obtained to evaluate the vascular anatomy. Multidetector CT imaging of the abdomen and pelvis was performed using the standard protocol during bolus administration of intravenous contrast. RADIATION DOSE REDUCTION: This exam was performed according to the departmental dose-optimization program which includes automated exposure control, adjustment of the mA and/or kV according to patient size and/or use of iterative reconstruction technique. CONTRAST:  OMNIPAQUE  IOHEXOL  350 MG/ML SOLN COMPARISON:  None Available. FINDINGS: CTA CHEST FINDINGS Cardiovascular: No evidence of embolism to the proximal subsegmental pulmonary artery level. Normal cardiac size. No pericardial effusion. There is dilated ascending aorta measuring up to 4.2 cm on this exam. Please note this examination was not performed without cardiac gating and therefore the measurement is subject to motion artifact. There are coronary artery calcifications, in keeping with coronary artery disease. There are also mild peripheral atherosclerotic vascular calcifications of thoracic aorta and its major branches. Mediastinum/Nodes: Visualized thyroid  gland appears grossly unremarkable. No solid / cystic mediastinal masses. There is small amount of fluid along the ascending aortic arch, likely fluid in the  pericardial recess, pericardial cyst or pericardial diverticulum. This is a benign finding. The esophagus is nondistended precluding optimal assessment. No axillary, mediastinal or hilar lymphadenopathy by size criteria. Lungs/Pleura: The central tracheo-bronchial tree is patent. There is mild, smooth, circumferential thickening of the segmental and subsegmental bronchial walls, throughout bilateral lungs, which is nonspecific. Findings are most commonly seen with bronchitis or reactive airway disease, such as asthma. There is frothy material along the right wall of the lower trachea, favored to represent mucous  secretions or aspiration. There are patchy areas of linear, plate-like atelectasis and/or scarring throughout bilateral lungs. No mass or consolidation. No pleural effusion or pneumothorax. There is a 3 x 4 mm noncalcified opacity in the lingular segment of left upper lobe (series 13, image 61), favored to represent atelectasis/scarring. No suspicious lung nodule. Musculoskeletal: The visualized soft tissues of the chest wall are grossly unremarkable. No suspicious osseous lesions. There are mild multilevel degenerative changes in the visualized spine. Review of the MIP images confirms the above findings. CT ABDOMEN and PELVIS FINDINGS Hepatobiliary: The liver is normal in size. Non-cirrhotic configuration. No suspicious mass. These is mild diffuse hepatic steatosis. No intrahepatic or extrahepatic bile duct dilation. No calcified gallstones. Normal gallbladder wall thickness. No pericholecystic inflammatory changes. Pancreas: Unremarkable. No pancreatic ductal dilatation or surrounding inflammatory changes. Spleen: Within normal limits. No focal lesion. Adrenals/Urinary Tract: Adrenal glands are unremarkable. No suspicious renal mass. There are partially exophytic single simple cyst in the right kidney measuring up to 2.4 x 2.8 cm. There is a nearly completely exophytic 2.4 x 3.1 cm cyst arising from the left kidney lower pole, posterolaterally. No nephroureterolithiasis or obstructive uropathy. Unremarkable urinary bladder. Stomach/Bowel: No disproportionate dilation of the small or large bowel loops. No evidence of abnormal bowel wall thickening or inflammatory changes. The appendix is unremarkable. Vascular/Lymphatic: No ascites or pneumoperitoneum. No abdominal or pelvic lymphadenopathy, by size criteria. No aneurysmal dilation of the major abdominal arteries. There are moderate peripheral atherosclerotic vascular calcifications of the aorta and its major branches. Reproductive: Normal size prostate. Symmetric  seminal vesicles. Other: There is a tiny fat containing umbilical hernia. The soft tissues and abdominal wall are otherwise unremarkable. Musculoskeletal: No suspicious osseous lesions. There are mild multilevel degenerative changes in the visualized spine. Review of the MIP images confirms the above findings. IMPRESSION: 1. No embolism to the proximal subsegmental pulmonary artery level. 2. No acute inflammatory process identified within the abdomen or pelvis. 3. There is a 3 x 4 mm noncalcified opacity in the lingular segment of left upper lobe, favored to represent atelectasis/scarring. 4. Ascending aorta measures up 4.2 cm on this non cardiac gated exam. 5. Multiple other nonacute observations, as described above. Aortic Atherosclerosis (ICD10-I70.0). Electronically Signed   By: Ree Molt M.D.   On: 01/04/2024 16:51   CT Angio Chest PE W and/or Wo Contrast Result Date: 01/04/2024 CLINICAL DATA:  Pulmonary embolism (PE) suspected, high prob; Abdominal pain, acute, nonlocalized. Nausea/vomiting and diarrhea. EXAM: CT ANGIOGRAPHY CHEST CT ABDOMEN AND PELVIS WITH CONTRAST TECHNIQUE: Multidetector CT imaging of the chest was performed using the standard protocol during bolus administration of intravenous contrast. Multiplanar CT image reconstructions and MIPs were obtained to evaluate the vascular anatomy. Multidetector CT imaging of the abdomen and pelvis was performed using the standard protocol during bolus administration of intravenous contrast. RADIATION DOSE REDUCTION: This exam was performed according to the departmental dose-optimization program which includes automated exposure control, adjustment of the mA and/or kV according to patient size and/or use  of iterative reconstruction technique. CONTRAST:  OMNIPAQUE  IOHEXOL  350 MG/ML SOLN COMPARISON:  None Available. FINDINGS: CTA CHEST FINDINGS Cardiovascular: No evidence of embolism to the proximal subsegmental pulmonary artery level. Normal  cardiac size. No pericardial effusion. There is dilated ascending aorta measuring up to 4.2 cm on this exam. Please note this examination was not performed without cardiac gating and therefore the measurement is subject to motion artifact. There are coronary artery calcifications, in keeping with coronary artery disease. There are also mild peripheral atherosclerotic vascular calcifications of thoracic aorta and its major branches. Mediastinum/Nodes: Visualized thyroid  gland appears grossly unremarkable. No solid / cystic mediastinal masses. There is small amount of fluid along the ascending aortic arch, likely fluid in the pericardial recess, pericardial cyst or pericardial diverticulum. This is a benign finding. The esophagus is nondistended precluding optimal assessment. No axillary, mediastinal or hilar lymphadenopathy by size criteria. Lungs/Pleura: The central tracheo-bronchial tree is patent. There is mild, smooth, circumferential thickening of the segmental and subsegmental bronchial walls, throughout bilateral lungs, which is nonspecific. Findings are most commonly seen with bronchitis or reactive airway disease, such as asthma. There is frothy material along the right wall of the lower trachea, favored to represent mucous secretions or aspiration. There are patchy areas of linear, plate-like atelectasis and/or scarring throughout bilateral lungs. No mass or consolidation. No pleural effusion or pneumothorax. There is a 3 x 4 mm noncalcified opacity in the lingular segment of left upper lobe (series 13, image 61), favored to represent atelectasis/scarring. No suspicious lung nodule. Musculoskeletal: The visualized soft tissues of the chest wall are grossly unremarkable. No suspicious osseous lesions. There are mild multilevel degenerative changes in the visualized spine. Review of the MIP images confirms the above findings. CT ABDOMEN and PELVIS FINDINGS Hepatobiliary: The liver is normal in size.  Non-cirrhotic configuration. No suspicious mass. These is mild diffuse hepatic steatosis. No intrahepatic or extrahepatic bile duct dilation. No calcified gallstones. Normal gallbladder wall thickness. No pericholecystic inflammatory changes. Pancreas: Unremarkable. No pancreatic ductal dilatation or surrounding inflammatory changes. Spleen: Within normal limits. No focal lesion. Adrenals/Urinary Tract: Adrenal glands are unremarkable. No suspicious renal mass. There are partially exophytic single simple cyst in the right kidney measuring up to 2.4 x 2.8 cm. There is a nearly completely exophytic 2.4 x 3.1 cm cyst arising from the left kidney lower pole, posterolaterally. No nephroureterolithiasis or obstructive uropathy. Unremarkable urinary bladder. Stomach/Bowel: No disproportionate dilation of the small or large bowel loops. No evidence of abnormal bowel wall thickening or inflammatory changes. The appendix is unremarkable. Vascular/Lymphatic: No ascites or pneumoperitoneum. No abdominal or pelvic lymphadenopathy, by size criteria. No aneurysmal dilation of the major abdominal arteries. There are moderate peripheral atherosclerotic vascular calcifications of the aorta and its major branches. Reproductive: Normal size prostate. Symmetric seminal vesicles. Other: There is a tiny fat containing umbilical hernia. The soft tissues and abdominal wall are otherwise unremarkable. Musculoskeletal: No suspicious osseous lesions. There are mild multilevel degenerative changes in the visualized spine. Review of the MIP images confirms the above findings. IMPRESSION: 1. No embolism to the proximal subsegmental pulmonary artery level. 2. No acute inflammatory process identified within the abdomen or pelvis. 3. There is a 3 x 4 mm noncalcified opacity in the lingular segment of left upper lobe, favored to represent atelectasis/scarring. 4. Ascending aorta measures up 4.2 cm on this non cardiac gated exam. 5. Multiple other  nonacute observations, as described above. Aortic Atherosclerosis (ICD10-I70.0). Electronically Signed   By: Ree Kimberlee HERO.D.  On: 01/04/2024 16:51   DG Chest Portable 1 View Result Date: 01/04/2024 CLINICAL DATA:  Shortness of breath. EXAM: PORTABLE CHEST 1 VIEW COMPARISON:  None Available. FINDINGS: Low lung volume. Moderately elevated left hemidiaphragm. There is blunting of left lateral costophrenic angle, which may suggest trace left pleural effusion. Right lateral costophrenic angle is clear. There are probable atelectatic changes at the left lung base. Bilateral lung fields are otherwise clear. Moderately enlarged cardio-mediastinal silhouette, which is likely accentuated by low lung volume and AP technique. No acute osseous abnormalities. The soft tissues are within normal limits. IMPRESSION: Low lung volume. Probable trace left pleural effusion with left basilar atelectasis. Electronically Signed   By: Ree Molt M.D.   On: 01/04/2024 14:36    EKG: Independently reviewed.  A-fib with RVR.  Assessment/Plan Principal Problem:   Atrial fibrillation with RVR (HCC) Active Problems:   Diabetes mellitus (HCC)   Essential hypertension, benign   Pure hypercholesterolemia   Abdominal pain   Atrial fibrillation with rapid ventricular response (HCC)    A-fib with RVR presently on Cardizem  infusion for rate control.  CHADS2 Vasc score of at least 2 patient has been started on heparin  infusion until patient can reliably take apixaban .  Check TSH 2D echo will consult cardiology.  Elevated troponin likely related to tachycardia but has been flat. Abdominal pain CT abdomen pelvis unremarkable.  Lipase mildly elevated but CT does not show any signs of pancreatitis.  Will keep patient on Protonix  IV possibility of including alcoholic gastritis. Alcohol abuse drinks 1 bottle of wine every day on CIWA protocol.  Librium  protocol. Hypertension takes lisinopril  and hydrochlorothiazide .  Presently on  Cardizem  infusion. Diabetes mellitus type 2 takes glipizide and metformin .  Check hemoglobin A1c presently on sliding scale coverage. Acute renal failure with no old labs to compare.  Follow metabolic panel after hydration.  Lactic acid was elevated improved with fluids.  Likely from dehydration. Hyperlipidemia on statins.  Since patient has new onset A-fib with RVR with abdominal pain will need further workup and more than 2 midnight stay.   DVT prophylaxis: Heparin  infusion. Code Status: Full code. Family Communication: Patient's son at the bedside. Disposition Plan: Monitored bed. Consults called: Cardiology. Admission status: Inpatient.

## 2024-01-04 NOTE — ED Notes (Signed)
 [facility]

## 2024-01-05 ENCOUNTER — Inpatient Hospital Stay (HOSPITAL_COMMUNITY)

## 2024-01-05 DIAGNOSIS — R7989 Other specified abnormal findings of blood chemistry: Secondary | ICD-10-CM | POA: Insufficient documentation

## 2024-01-05 DIAGNOSIS — I10 Essential (primary) hypertension: Secondary | ICD-10-CM | POA: Diagnosis not present

## 2024-01-05 DIAGNOSIS — N179 Acute kidney failure, unspecified: Secondary | ICD-10-CM | POA: Insufficient documentation

## 2024-01-05 DIAGNOSIS — I4892 Unspecified atrial flutter: Secondary | ICD-10-CM | POA: Diagnosis not present

## 2024-01-05 DIAGNOSIS — I4891 Unspecified atrial fibrillation: Secondary | ICD-10-CM

## 2024-01-05 DIAGNOSIS — E785 Hyperlipidemia, unspecified: Secondary | ICD-10-CM | POA: Diagnosis not present

## 2024-01-05 LAB — CBC WITH DIFFERENTIAL/PLATELET
Abs Immature Granulocytes: 0.01 K/uL (ref 0.00–0.07)
Basophils Absolute: 0 K/uL (ref 0.0–0.1)
Basophils Relative: 1 %
Eosinophils Absolute: 0.2 K/uL (ref 0.0–0.5)
Eosinophils Relative: 3 %
HCT: 36.9 % — ABNORMAL LOW (ref 39.0–52.0)
Hemoglobin: 12.5 g/dL — ABNORMAL LOW (ref 13.0–17.0)
Immature Granulocytes: 0 %
Lymphocytes Relative: 34 %
Lymphs Abs: 2.1 K/uL (ref 0.7–4.0)
MCH: 32.4 pg (ref 26.0–34.0)
MCHC: 33.9 g/dL (ref 30.0–36.0)
MCV: 95.6 fL (ref 80.0–100.0)
Monocytes Absolute: 1 K/uL (ref 0.1–1.0)
Monocytes Relative: 17 %
Neutro Abs: 2.7 K/uL (ref 1.7–7.7)
Neutrophils Relative %: 45 %
Platelets: 221 K/uL (ref 150–400)
RBC: 3.86 MIL/uL — ABNORMAL LOW (ref 4.22–5.81)
RDW: 12.7 % (ref 11.5–15.5)
WBC: 6 K/uL (ref 4.0–10.5)
nRBC: 0 % (ref 0.0–0.2)

## 2024-01-05 LAB — HEPATIC FUNCTION PANEL
ALT: 29 U/L (ref 0–44)
AST: 24 U/L (ref 15–41)
Albumin: 3.4 g/dL — ABNORMAL LOW (ref 3.5–5.0)
Alkaline Phosphatase: 42 U/L (ref 38–126)
Bilirubin, Direct: 0.1 mg/dL (ref 0.0–0.2)
Indirect Bilirubin: 0.7 mg/dL (ref 0.3–0.9)
Total Bilirubin: 0.8 mg/dL (ref 0.0–1.2)
Total Protein: 6.1 g/dL — ABNORMAL LOW (ref 6.5–8.1)

## 2024-01-05 LAB — BASIC METABOLIC PANEL WITH GFR
Anion gap: 7 (ref 5–15)
BUN: 16 mg/dL (ref 8–23)
CO2: 25 mmol/L (ref 22–32)
Calcium: 9.1 mg/dL (ref 8.9–10.3)
Chloride: 106 mmol/L (ref 98–111)
Creatinine, Ser: 1.33 mg/dL — ABNORMAL HIGH (ref 0.61–1.24)
GFR, Estimated: 58 mL/min — ABNORMAL LOW (ref >60)
Glucose, Bld: 119 mg/dL — ABNORMAL HIGH (ref 70–99)
Potassium: 4.7 mmol/L (ref 3.5–5.1)
Sodium: 138 mmol/L (ref 135–145)

## 2024-01-05 LAB — GLUCOSE, CAPILLARY
Glucose-Capillary: 126 mg/dL — ABNORMAL HIGH (ref 70–99)
Glucose-Capillary: 121 mg/dL — ABNORMAL HIGH (ref 70–99)
Glucose-Capillary: 174 mg/dL — ABNORMAL HIGH (ref 70–99)
Glucose-Capillary: 106 mg/dL — ABNORMAL HIGH (ref 70–99)

## 2024-01-05 LAB — URINALYSIS, ROUTINE W REFLEX MICROSCOPIC
Bilirubin Urine: NEGATIVE
Glucose, UA: 50 mg/dL — AB
Hgb urine dipstick: NEGATIVE
Ketones, ur: NEGATIVE mg/dL
Leukocytes,Ua: NEGATIVE
Nitrite: NEGATIVE
Protein, ur: NEGATIVE mg/dL
Specific Gravity, Urine: >1.046 — ABNORMAL HIGH (ref 1.005–1.030)
pH: 5 (ref 5.0–8.0)

## 2024-01-05 LAB — HEMOGLOBIN A1C
Hgb A1c MFr Bld: 6.1 % — ABNORMAL HIGH (ref 4.8–5.6)
Mean Plasma Glucose: 128.37 mg/dL

## 2024-01-05 LAB — TROPONIN I (HIGH SENSITIVITY)
Troponin I (High Sensitivity): 24 ng/L — ABNORMAL HIGH (ref <18)
Troponin I (High Sensitivity): 25 ng/L — ABNORMAL HIGH (ref <18)

## 2024-01-05 LAB — ECHOCARDIOGRAM COMPLETE
Height: 72 in
Weight: 3315.72 [oz_av]

## 2024-01-05 LAB — HEPARIN LEVEL (UNFRACTIONATED)
Heparin Unfractionated: >1.1 [IU]/mL — ABNORMAL HIGH (ref 0.30–0.70)
Heparin Unfractionated: 0.88 [IU]/mL — ABNORMAL HIGH (ref 0.30–0.70)

## 2024-01-05 LAB — APTT: aPTT: 33 s (ref 24–36)

## 2024-01-05 LAB — TSH: TSH: 4.487 u[IU]/mL (ref 0.350–4.500)

## 2024-01-05 LAB — MAGNESIUM: Magnesium: 1.4 mg/dL — ABNORMAL LOW (ref 1.7–2.4)

## 2024-01-05 LAB — HIV ANTIBODY (ROUTINE TESTING W REFLEX): HIV Screen 4th Generation wRfx: NONREACTIVE

## 2024-01-05 MED ORDER — ADULT MULTIVITAMIN W/MINERALS CH
1.0000 | ORAL_TABLET | Freq: Every day | ORAL | Status: DC
  Administered 2024-01-05 – 2024-01-07 (×3): 1 via ORAL
  Filled 2024-01-05 (×3): qty 1

## 2024-01-05 MED ORDER — INSULIN ASPART 100 UNIT/ML IJ SOLN: 3.0000 [IU] | Freq: Three times a day (TID) | INTRAMUSCULAR | Status: DC

## 2024-01-05 MED ORDER — THIAMINE HCL 100 MG/ML IJ SOLN
100.0000 mg | Freq: Once | INTRAMUSCULAR | Status: AC
  Administered 2024-01-05: 100 mg via INTRAMUSCULAR
  Filled 2024-01-05: qty 2

## 2024-01-05 MED ORDER — CHLORDIAZEPOXIDE HCL 25 MG PO CAPS: 25.0000 mg | ORAL_CAPSULE | Freq: Four times a day (QID) | ORAL | Status: DC | PRN

## 2024-01-05 MED ORDER — INSULIN ASPART 100 UNIT/ML IJ SOLN: 0.0000 [IU] | Freq: Every day | INTRAMUSCULAR | Status: DC

## 2024-01-05 MED ORDER — HYDROXYZINE HCL 25 MG PO TABS: 25.0000 mg | ORAL_TABLET | Freq: Four times a day (QID) | ORAL | Status: DC | PRN

## 2024-01-05 MED ORDER — DILTIAZEM HCL 30 MG PO TABS
60.0000 mg | ORAL_TABLET | Freq: Four times a day (QID) | ORAL | Status: DC
  Administered 2024-01-05 – 2024-01-06 (×5): 60 mg via ORAL
  Filled 2024-01-05 (×5): qty 2

## 2024-01-05 MED ORDER — HEPARIN BOLUS VIA INFUSION
3000.0000 [IU] | Freq: Once | INTRAVENOUS | Status: AC
  Administered 2024-01-05: 3000 [IU] via INTRAVENOUS
  Filled 2024-01-05: qty 3000

## 2024-01-05 MED ORDER — PANTOPRAZOLE SODIUM 40 MG IV SOLR
40.0000 mg | INTRAVENOUS | Status: DC
  Administered 2024-01-05 – 2024-01-07 (×3): 40 mg via INTRAVENOUS
  Filled 2024-01-05 (×3): qty 10

## 2024-01-05 MED ORDER — HEPARIN BOLUS VIA INFUSION
5000.0000 [IU] | Freq: Once | INTRAVENOUS | Status: AC
  Administered 2024-01-05: 5000 [IU] via INTRAVENOUS
  Filled 2024-01-05: qty 5000

## 2024-01-05 MED ORDER — CHLORDIAZEPOXIDE HCL 25 MG PO CAPS: 25.0000 mg | ORAL_CAPSULE | ORAL | Status: DC

## 2024-01-05 MED ORDER — MAGNESIUM SULFATE 2 GM/50ML IV SOLN
2.0000 g | Freq: Once | INTRAVENOUS | Status: AC
  Administered 2024-01-05: 2 g via INTRAVENOUS
  Filled 2024-01-05: qty 50

## 2024-01-05 MED ORDER — INSULIN ASPART 100 UNIT/ML IJ SOLN
0.0000 [IU] | Freq: Three times a day (TID) | INTRAMUSCULAR | Status: DC
  Administered 2024-01-05: 2 [IU] via SUBCUTANEOUS
  Administered 2024-01-05 – 2024-01-07 (×5): 1 [IU] via SUBCUTANEOUS

## 2024-01-05 MED ORDER — HEPARIN (PORCINE) 25000 UT/250ML-% IV SOLN
1200.0000 [IU]/h | INTRAVENOUS | Status: AC
  Administered 2024-01-05: 1200 [IU]/h via INTRAVENOUS

## 2024-01-05 MED ORDER — CHLORDIAZEPOXIDE HCL 25 MG PO CAPS
25.0000 mg | ORAL_CAPSULE | Freq: Three times a day (TID) | ORAL | Status: AC
  Administered 2024-01-06 (×3): 25 mg via ORAL
  Filled 2024-01-05 (×3): qty 1

## 2024-01-05 MED ORDER — HEPARIN (PORCINE) 25000 UT/250ML-% IV SOLN
1400.0000 [IU]/h | INTRAVENOUS | Status: AC
  Administered 2024-01-05: 1000 [IU]/h via INTRAVENOUS
  Filled 2024-01-05: qty 250

## 2024-01-05 MED ORDER — CHLORDIAZEPOXIDE HCL 25 MG PO CAPS
25.0000 mg | ORAL_CAPSULE | Freq: Four times a day (QID) | ORAL | Status: AC
  Administered 2024-01-05 (×4): 25 mg via ORAL
  Filled 2024-01-05 (×4): qty 1

## 2024-01-05 MED ORDER — CHLORDIAZEPOXIDE HCL 25 MG PO CAPS: 25.0000 mg | ORAL_CAPSULE | Freq: Every day | ORAL | Status: DC

## 2024-01-05 NOTE — Plan of Care (Signed)
 ?  Problem: Clinical Measurements: ?Goal: Will remain free from infection ?Outcome: Progressing ?  ?

## 2024-01-05 NOTE — Progress Notes (Signed)
 PHARMACY - ANTICOAGULATION CONSULT NOTE  Pharmacy Consult for Heparin   Indication: atrial fibrillation  No Known Allergies  Patient Measurements: Height: 6' (182.9 cm) Weight: 94 kg (207 lb 3.7 oz) IBW/kg (Calculated) : 77.6 HEPARIN  DW (KG): 94  Vital Signs: Temp: 98 F (36.7 C) (08/26 2317) Temp Source: Oral (08/26 2317) BP: 125/85 (08/26 2317) Pulse Rate: 85 (08/26 2317)  Labs: Recent Labs    01/04/24 1440 01/05/24 0001 01/05/24 0107  HGB 13.7 12.5*  --   HCT 39.3 36.9*  --   PLT 232 221  --   CREATININE 1.36* 1.33*  --   TROPONINIHS  --  24* 25*    Estimated Creatinine Clearance: 61.5 mL/min (A) (by C-G formula based on SCr of 1.33 mg/dL (H)).   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    Assessment: 73 y/o M presents to the ED with abdominal pain and afib. Starting heparin . PTA meds reviewed. Above labs reviewed.   Goal of Therapy:  Heparin  level 0.3-0.7 units/ml Monitor platelets by anticoagulation protocol: Yes   Plan:  Heparin  5000 units BOLUS Start heparin  drip at 1200 units/hr Heparin  level in 8 hours Daily CBC/Heparin  level Monitor for bleeding  Lynwood Mckusick, PharmD, BCPS Clinical Pharmacist Phone: [phone]

## 2024-01-05 NOTE — Progress Notes (Signed)
 PHARMACY - ANTICOAGULATION CONSULT NOTE  Pharmacy Consult for Heparin   Indication: atrial fibrillation  No Known Allergies  Patient Measurements: Height: 6' (182.9 cm) Weight: 94 kg (207 lb 3.7 oz) IBW/kg (Calculated) : 77.6 HEPARIN  DW (KG): 94  Vital Signs: Temp: 97.4 F (36.3 C) (08/27 0723) Temp Source: Oral (08/27 0723) BP: 133/89 (08/27 0723) Pulse Rate: 71 (08/27 0723)  Labs: Recent Labs    01/04/24 1440 01/05/24 0001 01/05/24 0107 01/05/24 0942  HGB 13.7 12.5*  --   --   HCT 39.3 36.9*  --   --   PLT 232 221  --   --   HEPARINUNFRC  --   --   --  >1.10*  CREATININE 1.36* 1.33*  --   --   TROPONINIHS  --  24* 25*  --     Estimated Creatinine Clearance: 61.5 mL/min (A) (by C-G formula based on SCr of 1.33 mg/dL (H)).   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    Assessment:  73 y.o. male with medical history significant for alcohol abuse not on anticoagulation PTA who presents with new atrial fibrillation with RVR. Pharmacy consulted to start heparin .  Heparin  level above goal > 1.1. No issues with heparin  infusion reported and no signs of bleeding noted. Will hold heparin  infusion and lowe rate.  Goal of Therapy:  Heparin  level 0.3-0.7 units/ml Monitor platelets by anticoagulation protocol: Yes   Plan:  Hold heparin  x1 hour Resume heparin  infusion at lower rate of 1000 units/hr Check heparin  level in 8 hours and daily while on heparin  Continue to monitor H&H and platelets  Thank you for allowing pharmacy to be a part of this patient's care.  Shelba Collier, PharmD, BCPS Clinical Pharmacist

## 2024-01-05 NOTE — Progress Notes (Signed)
   01/05/24 0919  Urine Measurement/Characteristics  Bladder Scan Volume (mL) 680 mL    Verbal order from Dr. Celinda for in and out cath

## 2024-01-05 NOTE — Consult Note (Addendum)
 Cardiology Consultation   Patient ID: Darren Gutierrez MRN: [id number]; DOB: 1951-05-06  Admit date: 01/04/2024 Date of Consult: 01/05/2024  PCP:  Patient, No Pcp Per   [facility] Providers Cardiologist:  Darren DELENA Leavens, MD      Patient Profile: Darren Gutierrez is a 73 y.o. male with a hx of hypertension, hyperlipidemia, type 2 diabetes, and alcohol abuse who is being seen 01/05/2024 for the evaluation of atrial fibrillation at the request of Darren Odell Castor MD.  History of Present Illness: Darren Gutierrez is an 73 year old male who per chart review has not been seen by cardiology in the past.   Patient presented to the emergency department on 01/04/2024 complaining of epigastric pain and was found to be in A-fib with RVR.  Was initially started on IV Cardizem  and later transition to oral Cardizem .   Patient's daughter was present during the interview.  Patient does not speak English and is comfortable with his daughter interpreting.  Reported that he came to the hospital because of abdominal pain that started about 2 to 3 weeks ago.  Denies any ongoing abdominal pain.  Denies any prior history of atrial fibrillation. Patient denied any symptoms with the atrial fibrillation such as palpitations, shortness of breath, fatigue, lightheadedness, and chest pain.  Was able to get up and walk to the bathroom and back comfortably.  Patient has been less active since he retired last year but is still able to climb up 2 flights of stairs, go for a mile walk, carrying groceries, and do more than 4 metabolic equivalents of exertion.  Patient is less active because of pain from his gout.  Denies any symptoms concerning for OSA such as snoring, daytime somnolence, or poor sleep quality.  Patient drinks wine about 1 L a day.  Has not drink as much in the past few months since he has had concerns with his health.  Patient is willing to reduce his alcohol intake.  Quit using tobacco  about 23 years ago.  Denies any cannabis use or illicit substance use.   Labs showed potassium of 4.7, elevated creatinine of 1.33, decreased albumin of 3.4, TSH of 4.487, normal T4, elevated hemoglobin A1c of 6.1, high-sensitivity troponin T 26 > 27, high-sensitivity troponin I  24 > 25, slightly elevated lipase of 55, elevated lactic acid of 2.6, normal WBC count of 6, and slight anemia with a hemoglobin of 12.5.  Pulmonary CTA on 01/04/2024 showed no PE, ascending aorta measuring 4.2 cm, aortic atherosclerosis, and normal cardiac size with coronary artery calcifications. Abdomen and pelvis CT found no acute inflammatory process in abdomen or pelvis.  EKG from 01/04/2024 showed atrial fibrillation with a rate of 105 Q waves on inferior and lateral leads indicative possible prior MI.  No prior EKGs to compare to.   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    History reviewed. No pertinent surgical history.   Home Medications:  Prior to Admission medications   Medication Sig Start Date End Date Taking? Authorizing Provider  atorvastatin  (LIPITOR) 40 MG tablet TAKE 1 TABLET BY MOUTH ONCE DAILY 09/10/17  Yes Darren Ozell CROME, PA-C  CHOLECALCIFEROL PO Take 1 tablet by mouth daily. Dosage unknown   Yes [provider]  glipiZIDE (GLUCOTROL XL) 2.5 MG 24  hr tablet Take 2.5 mg by mouth daily with breakfast. 05/21/19  Yes [provider]  lisinopril -hydrochlorothiazide  (PRINZIDE ,ZESTORETIC ) 20-25 MG tablet Take 1 tablet by mouth daily. 01/09/17  Yes Darren Ozell CROME, PA-C  meloxicam  (MOBIC ) 15 MG tablet Take 15-30 mg by mouth daily as needed. 11/25/23  Yes [provider]  metFORMIN  (GLUCOPHAGE ) 1000 MG tablet Take 1,000 mg by mouth 2 (two) times daily with a meal.    Yes [provider]    Scheduled Meds:  atorvastatin   40 mg Oral Daily   chlordiazePOXIDE   25 mg Oral QID   Followed by   Darren Gutierrez ON 01/06/2024] chlordiazePOXIDE   25 mg Oral TID   Followed by   Darren Gutierrez ON 01/07/2024] chlordiazePOXIDE   25 mg Oral BH-qamhs   Followed by   Darren Gutierrez ON 01/08/2024] chlordiazePOXIDE   25 mg Oral Daily   diltiazem   60 mg Oral Q6H   folic acid   1 mg Oral Daily   insulin  aspart  0-9 Units Subcutaneous TID WC   multivitamin with minerals  1 tablet Oral Daily   pantoprazole  (PROTONIX ) IV  40 mg Intravenous Q24H   thiamine   100 mg Oral Daily   Or   thiamine   100 mg Intravenous Daily   Continuous Infusions:  diltiazem  (CARDIZEM ) infusion Stopped (01/05/24 0802)   heparin      magnesium  sulfate bolus IVPB 2 g (01/05/24 1227)   PRN Meds: acetaminophen  **OR** acetaminophen , chlordiazePOXIDE , hydrOXYzine , LORazepam  **OR** LORazepam   Allergies:   No Known Allergies  Social History:   Social History   Socioeconomic History   Marital status: Married    Spouse name: Not on file   Number of children: Not on file   Years of education: Not on file   Highest education level: Not on file  Occupational History   Not on file  Tobacco Use   Smoking status: Former    Current packs/day: 0.00    Types: Cigarettes    Quit date: 2002    Years since quitting: 23.6   Smokeless tobacco: Never  Substance and Sexual Activity   Alcohol use: Yes    Comment: Occ   Drug use: No   Sexual activity: Never  Other Topics Concern   Not on file  Social History Narrative   Marital status: married      Children: 2 children; 3 grandchildren.      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      Tobacco; none; quit in 2002.      Alcohol:  5 beers per day.      Exercise:  none   Social Drivers of Corporate investment banker Strain: Not on file  Food Insecurity: Patient Declined (01/05/2024)   Hunger Vital Sign    Worried About Running  Out of Food in the Last Year: Patient declined    Ran Out of Food in the Last Year: Patient declined  Transportation Needs: Patient Declined (01/05/2024)   PRAPARE - Administrator, Civil Service (Medical): Patient declined    Lack of Transportation (Non-Medical): Patient declined  Physical Activity: Not on file  Stress: Not on file  Social Connections: Patient Declined (01/05/2024)   Social Connection and Isolation Panel    Frequency of Communication with Friends and Family: Patient declined    Frequency of Social Gatherings with Friends and Family: Patient declined    Attends Religious Services: Patient declined    Database administrator or Organizations: Patient declined  Attends Banker Meetings: Patient declined    Marital Status: Patient declined  Intimate Partner Violence: Unknown (01/05/2024)   Humiliation, Afraid, Rape, and Kick questionnaire    Fear of Current or Ex-Partner: Patient declined    Emotionally Abused: Not on file    Physically Abused: Patient declined    Sexually Abused: Patient declined    Family History:    Family History  Problem Relation Age of Onset   Hypertension Mother    Hypertension Father      ROS:  Please see the history of present illness.   All other ROS reviewed and negative.     Physical Exam/Data: Vitals:   01/04/24 2317 01/05/24 0418 01/05/24 0723 01/05/24 1240  BP: 125/85 (!) 123/95 133/89 129/75  Pulse: 85 66 71 63  Resp: 20 20 20  (!) 23  Temp: 98 F (36.7 C) 98 F (36.7 C) (!) 97.4 F (36.3 C) (!) 97.4 F (36.3 C)  TempSrc: Oral Oral Oral Oral  SpO2: 96% 93% (!) 89% 91%  Weight:      Height:        Intake/Output Summary (Last 24 hours) at 01/05/2024 1251 Last data filed at 01/05/2024 1119 Gross per 24 hour  Intake 1766.41 ml  Output 675 ml  Net 1091.41 ml      01/04/2024    2:06 PM 02/08/2017    4:07 PM 01/09/2017    9:29 AM  Last 3 Weights  Weight (lbs) 207 lb 3.7 oz 236 lb 6.4 oz 239 lb   Weight (kg) 94 kg 107.23 kg 108.41 kg     Body mass index is 28.11 kg/m.  General:  Well nourished, well developed, in no acute distress, on 2 L nasal cannula. HEENT: normal Neck: no JVD Vascular: No carotid bruits; Distal pulses 2+ bilaterally Cardiac: Irregularly irregular rhythm, 2 out of 6 systolic murmur Lungs:  mild bibasilar crackles. Abd: soft, nontender, no hepatomegaly  Ext: no edema Musculoskeletal:  No deformities Skin: warm and dry  Neuro:  no focal abnormalities noted Psych:  Normal affect   EKG:  The EKG was personally reviewed and demonstrates:  EKG from 01/04/2024 showed coarse atrial fibrillation with a rate of 105 Q waves on inferior and lateral leads indicative possible prior MI.  No prior EKGs to compare to. Telemetry:  Telemetry was personally reviewed and demonstrates: Coarse atrial fibrillation with heart rates in the 80s to 90s.  This is improved from heart rates in the 90s to 130s yesterday.  Relevant CV Studies: Echo pending  Laboratory Data: High Sensitivity Troponin:   Recent Labs  Lab 01/05/24 0001 01/05/24 0107  TROPONINIHS 24* 25*     Chemistry Recent Labs  Lab 01/04/24 1440 01/05/24 0001 01/05/24 0107  NA 140 138  --   K 4.4 4.7  --   CL 101 106  --   CO2 23 25  --   GLUCOSE 106* 119*  --   BUN 16 16  --   CREATININE 1.36* 1.33*  --   CALCIUM  10.0 9.1  --   MG  --   --  1.4*  GFRNONAA 56* 58*  --   ANIONGAP 16* 7  --     Recent Labs  Lab 01/04/24 1440 01/05/24 0001  PROT 7.4 6.1*  ALBUMIN 4.4 3.4*  AST 35 24  ALT 36 29  ALKPHOS 54 42  BILITOT 0.4 0.8   Lipids No results for input(s): CHOL, TRIG, HDL, LABVLDL, LDLCALC, CHOLHDL in the last 168  hours.  Hematology Recent Labs  Lab 01/04/24 1440 01/05/24 0001  WBC 5.5 6.0  RBC 4.19* 3.86*  HGB 13.7 12.5*  HCT 39.3 36.9*  MCV 93.8 95.6  MCH 32.7 32.4  MCHC 34.9 33.9  RDW 12.6 12.7  PLT 232 221   Thyroid   Recent Labs  Lab 01/04/24 1519 01/05/24 0001   TSH 2.500 4.487  FREET4 0.74  --     BNPNo results for input(s): BNP, PROBNP in the last 168 hours.  DDimer No results for input(s): DDIMER in the last 168 hours.  Radiology/Studies:  CT ABDOMEN PELVIS W CONTRAST Result Date: 01/04/2024 CLINICAL DATA:  Pulmonary embolism (PE) suspected, high prob; Abdominal pain, acute, nonlocalized. Nausea/vomiting and diarrhea. EXAM: CT ANGIOGRAPHY CHEST CT ABDOMEN AND PELVIS WITH CONTRAST TECHNIQUE: Multidetector CT imaging of the chest was performed using the standard protocol during bolus administration of intravenous contrast. Multiplanar CT image reconstructions and MIPs were obtained to evaluate the vascular anatomy. Multidetector CT imaging of the abdomen and pelvis was performed using the standard protocol during bolus administration of intravenous contrast. RADIATION DOSE REDUCTION: This exam was performed according to the departmental dose-optimization program which includes automated exposure control, adjustment of the mA and/or kV according to patient size and/or use of iterative reconstruction technique. CONTRAST:  OMNIPAQUE  IOHEXOL  350 MG/ML SOLN COMPARISON:  None Available. FINDINGS: CTA CHEST FINDINGS Cardiovascular: No evidence of embolism to the proximal subsegmental pulmonary artery level. Normal cardiac size. No pericardial effusion. There is dilated ascending aorta measuring up to 4.2 cm on this exam. Please note this examination was not performed without cardiac gating and therefore the measurement is subject to motion artifact. There are coronary artery calcifications, in keeping with coronary artery disease. There are also mild peripheral atherosclerotic vascular calcifications of thoracic aorta and its major branches. Mediastinum/Nodes: Visualized thyroid  gland appears grossly unremarkable. No solid / cystic mediastinal masses. There is small amount of fluid along the ascending aortic arch, likely fluid in the pericardial recess,  pericardial cyst or pericardial diverticulum. This is a benign finding. The esophagus is nondistended precluding optimal assessment. No axillary, mediastinal or hilar lymphadenopathy by size criteria. Lungs/Pleura: The central tracheo-bronchial tree is patent. There is mild, smooth, circumferential thickening of the segmental and subsegmental bronchial walls, throughout bilateral lungs, which is nonspecific. Findings are most commonly seen with bronchitis or reactive airway disease, such as asthma. There is frothy material along the right wall of the lower trachea, favored to represent mucous secretions or aspiration. There are patchy areas of linear, plate-like atelectasis and/or scarring throughout bilateral lungs. No mass or consolidation. No pleural effusion or pneumothorax. There is a 3 x 4 mm noncalcified opacity in the lingular segment of left upper lobe (series 13, image 61), favored to represent atelectasis/scarring. No suspicious lung nodule. Musculoskeletal: The visualized soft tissues of the chest wall are grossly unremarkable. No suspicious osseous lesions. There are mild multilevel degenerative changes in the visualized spine. Review of the MIP images confirms the above findings. CT ABDOMEN and PELVIS FINDINGS Hepatobiliary: The liver is normal in size. Non-cirrhotic configuration. No suspicious mass. These is mild diffuse hepatic steatosis. No intrahepatic or extrahepatic bile duct dilation. No calcified gallstones. Normal gallbladder wall thickness. No pericholecystic inflammatory changes. Pancreas: Unremarkable. No pancreatic ductal dilatation or surrounding inflammatory changes. Spleen: Within normal limits. No focal lesion. Adrenals/Urinary Tract: Adrenal glands are unremarkable. No suspicious renal mass. There are partially exophytic single simple cyst in the right kidney measuring up to 2.4 x 2.8 cm. There  is a nearly completely exophytic 2.4 x 3.1 cm cyst arising from the left kidney lower  pole, posterolaterally. No nephroureterolithiasis or obstructive uropathy. Unremarkable urinary bladder. Stomach/Bowel: No disproportionate dilation of the small or large bowel loops. No evidence of abnormal bowel wall thickening or inflammatory changes. The appendix is unremarkable. Vascular/Lymphatic: No ascites or pneumoperitoneum. No abdominal or pelvic lymphadenopathy, by size criteria. No aneurysmal dilation of the major abdominal arteries. There are moderate peripheral atherosclerotic vascular calcifications of the aorta and its major branches. Reproductive: Normal size prostate. Symmetric seminal vesicles. Other: There is a tiny fat containing umbilical hernia. The soft tissues and abdominal wall are otherwise unremarkable. Musculoskeletal: No suspicious osseous lesions. There are mild multilevel degenerative changes in the visualized spine. Review of the MIP images confirms the above findings. IMPRESSION: 1. No embolism to the proximal subsegmental pulmonary artery level. 2. No acute inflammatory process identified within the abdomen or pelvis. 3. There is a 3 x 4 mm noncalcified opacity in the lingular segment of left upper lobe, favored to represent atelectasis/scarring. 4. Ascending aorta measures up 4.2 cm on this non cardiac gated exam. 5. Multiple other nonacute observations, as described above. Aortic Atherosclerosis (ICD10-I70.0). Electronically Signed   By: Ree Molt M.D.   On: 01/04/2024 16:51   CT Angio Chest PE W and/or Wo Contrast Result Date: 01/04/2024 CLINICAL DATA:  Pulmonary embolism (PE) suspected, high prob; Abdominal pain, acute, nonlocalized. Nausea/vomiting and diarrhea. EXAM: CT ANGIOGRAPHY CHEST CT ABDOMEN AND PELVIS WITH CONTRAST TECHNIQUE: Multidetector CT imaging of the chest was performed using the standard protocol during bolus administration of intravenous contrast. Multiplanar CT image reconstructions and MIPs were obtained to evaluate the vascular anatomy.  Multidetector CT imaging of the abdomen and pelvis was performed using the standard protocol during bolus administration of intravenous contrast. RADIATION DOSE REDUCTION: This exam was performed according to the departmental dose-optimization program which includes automated exposure control, adjustment of the mA and/or kV according to patient size and/or use of iterative reconstruction technique. CONTRAST:  OMNIPAQUE  IOHEXOL  350 MG/ML SOLN COMPARISON:  None Available. FINDINGS: CTA CHEST FINDINGS Cardiovascular: No evidence of embolism to the proximal subsegmental pulmonary artery level. Normal cardiac size. No pericardial effusion. There is dilated ascending aorta measuring up to 4.2 cm on this exam. Please note this examination was not performed without cardiac gating and therefore the measurement is subject to motion artifact. There are coronary artery calcifications, in keeping with coronary artery disease. There are also mild peripheral atherosclerotic vascular calcifications of thoracic aorta and its major branches. Mediastinum/Nodes: Visualized thyroid  gland appears grossly unremarkable. No solid / cystic mediastinal masses. There is small amount of fluid along the ascending aortic arch, likely fluid in the pericardial recess, pericardial cyst or pericardial diverticulum. This is a benign finding. The esophagus is nondistended precluding optimal assessment. No axillary, mediastinal or hilar lymphadenopathy by size criteria. Lungs/Pleura: The central tracheo-bronchial tree is patent. There is mild, smooth, circumferential thickening of the segmental and subsegmental bronchial walls, throughout bilateral lungs, which is nonspecific. Findings are most commonly seen with bronchitis or reactive airway disease, such as asthma. There is frothy material along the right wall of the lower trachea, favored to represent mucous secretions or aspiration. There are patchy areas of linear, plate-like atelectasis  and/or scarring throughout bilateral lungs. No mass or consolidation. No pleural effusion or pneumothorax. There is a 3 x 4 mm noncalcified opacity in the lingular segment of left upper lobe (series 13, image 61), favored to represent atelectasis/scarring. No  suspicious lung nodule. Musculoskeletal: The visualized soft tissues of the chest wall are grossly unremarkable. No suspicious osseous lesions. There are mild multilevel degenerative changes in the visualized spine. Review of the MIP images confirms the above findings. CT ABDOMEN and PELVIS FINDINGS Hepatobiliary: The liver is normal in size. Non-cirrhotic configuration. No suspicious mass. These is mild diffuse hepatic steatosis. No intrahepatic or extrahepatic bile duct dilation. No calcified gallstones. Normal gallbladder wall thickness. No pericholecystic inflammatory changes. Pancreas: Unremarkable. No pancreatic ductal dilatation or surrounding inflammatory changes. Spleen: Within normal limits. No focal lesion. Adrenals/Urinary Tract: Adrenal glands are unremarkable. No suspicious renal mass. There are partially exophytic single simple cyst in the right kidney measuring up to 2.4 x 2.8 cm. There is a nearly completely exophytic 2.4 x 3.1 cm cyst arising from the left kidney lower pole, posterolaterally. No nephroureterolithiasis or obstructive uropathy. Unremarkable urinary bladder. Stomach/Bowel: No disproportionate dilation of the small or large bowel loops. No evidence of abnormal bowel wall thickening or inflammatory changes. The appendix is unremarkable. Vascular/Lymphatic: No ascites or pneumoperitoneum. No abdominal or pelvic lymphadenopathy, by size criteria. No aneurysmal dilation of the major abdominal arteries. There are moderate peripheral atherosclerotic vascular calcifications of the aorta and its major branches. Reproductive: Normal size prostate. Symmetric seminal vesicles. Other: There is a tiny fat containing umbilical hernia. The soft  tissues and abdominal wall are otherwise unremarkable. Musculoskeletal: No suspicious osseous lesions. There are mild multilevel degenerative changes in the visualized spine. Review of the MIP images confirms the above findings. IMPRESSION: 1. No embolism to the proximal subsegmental pulmonary artery level. 2. No acute inflammatory process identified within the abdomen or pelvis. 3. There is a 3 x 4 mm noncalcified opacity in the lingular segment of left upper lobe, favored to represent atelectasis/scarring. 4. Ascending aorta measures up 4.2 cm on this non cardiac gated exam. 5. Multiple other nonacute observations, as described above. Aortic Atherosclerosis (ICD10-I70.0). Electronically Signed   By: Ree Molt M.D.   On: 01/04/2024 16:51   DG Chest Portable 1 View Result Date: 01/04/2024 CLINICAL DATA:  Shortness of breath. EXAM: PORTABLE CHEST 1 VIEW COMPARISON:  None Available. FINDINGS: Low lung volume. Moderately elevated left hemidiaphragm. There is blunting of left lateral costophrenic angle, which may suggest trace left pleural effusion. Right lateral costophrenic angle is clear. There are probable atelectatic changes at the left lung base. Bilateral lung fields are otherwise clear. Moderately enlarged cardio-mediastinal silhouette, which is likely accentuated by low lung volume and AP technique. No acute osseous abnormalities. The soft tissues are within normal limits. IMPRESSION: Low lung volume. Probable trace left pleural effusion with left basilar atelectasis. Electronically Signed   By: Ree Molt M.D.   On: 01/04/2024 14:36     Assessment and Plan: Kemon Prehn is a 73 y.o. male with a hx of hypertension, hyperlipidemia, type 2 diabetes, and alcohol abuse who is being seen 01/05/2024 for the evaluation of atrial fibrillation at the request of Darren Odell Castor MD.  New onset atrial fibrillation Hypertension Prior to admission was on lisinopril /hydrochlorothiazide  Patient  presented to the emergency department on 01/04/2024 complaining of epigastric pain and was found to be in A-fib with RVR.  Was initially started on IV Cardizem  and later transition to oral Cardizem  60 mg every 6 hours.  CHA2DS2-VASc Score = 4 [CHF History: 0, HTN History: 1, Diabetes History: 1, Stroke History: 0, Vascular Disease History: 1, Age Score: 1, Gender Score: 0].  Therefore, the patient's annual risk of stroke is 4.8 %.  EKG from 01/04/2024 showed atrial fibrillation with a rate of 105 Q waves on inferior and lateral leads indicative possible prior MI.  No prior EKGs to compare to. Denies any symptoms concerning for OSA. - Labs showed potassium of 4.7, ordered magnesium  and it was decreased at 1.4, elevated creatinine of 1.33, TSH normal.  Potassium at goal of greater than 4.  Order magnesium  replacement for a goal of greater than 2. Rate control on telemetry has improved over the past 2 to 3 days.  Heart rates are currently in the 90s to 80s.  Was transition from IV to oral Cardizem .  has taken 1 dose of the oral Cardizem .  Blood pressure has been slightly hypertensive at times most recent BP 133/89. Ordered magnesium  replacement Echo pending Stop home lisinopril /hydrochlorothiazide . Continue Cardizem  60mg  every 6 hours. Will make final determination following echocardiogram. Continue IV heparin . If echo shows a normal LVEF will plan for patient to take Eliquis  for 3 weeks and get an outpatient DCCV.   Alcohol abuse On Librium  protocol per primary May be contributing to the patient's atrial fibrillation. Patient willing to reduce alcohol use.   Elevated high-sensitivity troponin Coronary calcification Hyperlipidemia Denies any chest pain high-sensitivity troponin T 26 > 27, high-sensitivity troponin I  24 > 25 Pulmonary CTA on 01/04/2024 showed no PE, ascending aorta measuring 4.2 cm, aortic atherosclerosis, and normal cardiac size with coronary artery calcifications.  Increases  CHA2DS2-VASc score.   Elevated troponins most consistent with demand ischemia from A-fib with RVR Recommend follow-up imaging of ascending aorta in 12 months depending on echo results Continue atorvastatin  40 mg daily   Type 2 diabetes Increases CHA2DS2-VASc score. Prior to admission was on lisinopril /hydrochlorothiazide  May consider low-dose ACE or ARB for renal protection Management per primary   Hypoxia No pleural effusion was seen on pulmonary CT that was done yesterday.  Patient has an SpO2 that was in the high 80s and low 90s and was placed on oxygen.  On interview patient denies any shortness of breath. Denies any home oxygen use. Management per primary   Otherwise manage per primary   Risk Assessment/Risk Scores:       CHA2DS2-VASc Score = 4   This indicates a 4.8% annual risk of stroke. The patient's score is based upon: CHF History: 0 HTN History: 1 Diabetes History: 1 Stroke History: 0 Vascular Disease History: 1 Age Score: 1 Gender Score: 0        For questions or updates, please contact [facility] Please consult [email or web address] for contact info under    Signed, Morse Clause, PA-C  01/05/2024 12:51 PM   I have personally seen and examined the patient.  My HPI, Exam, and assessment and plan are below, independent of the NPP above.  Darren Gutierrez presents with atrial fibrillation with rapid ventricular response and epigastric pain. He is accompanied by his daughter who he elects to use instead of a formal interpretor. He was referred by his primary care physician after being found tachycardic and in atrial fibrillation with RVR.  He was found to be in atrial fibrillation with rapid ventricular response during a primary care visit. No symptoms typically associated with atrial fibrillation such as palpitations, shortness of breath, fatigue, lightheadedness, or chest pain. He is able to walk to the bathroom and back comfortably and feels more like  himself recently. He has been less active since retiring last year, and his activity is further limited by gout. Initially on Eliquis , he was switched to IV heparin   upon hospital admission. He received one dose of Eliquis  the previous evening.  He reports experiencing epigastric pain for the past two to three weeks, which he associates with his daily consumption of a bottle of wine. An abdominal CT scan was performed and was negative for any acute process. The pain has resolved and he is feeling better now.  He has been drinking less alcohol since experiencing stomach issues. Previously drinking about a liter of wine daily, he denies any history of alcohol withdrawal symptoms, although he is on a Librium  protocol for alcohol withdrawal management.  Exam notable for  Gen: no distress   Neck: No JVD Ears:  Dempsey Sign Cardiac: No Rubs or Gallops, no Murmur, IRIR rhythm +2 radial pulses Respiratory: Decreased breath sounds left lower lung field, normal effort, normal  respiratory rate GI: Soft, nontender, non-distended  MS: No  edema;  moves all extremities Integument: Skin feels warm Neuro:  At time of evaluation, alert and oriented to person/place/time/situation  Psych: Normal affect, patient feels fine  RADIOLOGY  CTPE: Right atrial dilation, normal borderline coronary sinus (01/04/2024) My Review Chest CT: Atelectatic scarring, mild aortic dilation (01/04/2024) My Review  DIAGNOSTIC EKG: Coarse atrial fibrillation, Q waves in anterior and inferior leads (01/04/2024)  In assessment and plan:   Atrial fibrillation with rapid ventricular response Atrial fibrillation with RVR was identified without prior history. He is asymptomatic with no palpitations, shortness of breath, or chest pain. Initial management included IV medication to control heart rate, now normalized. Differential includes alcohol-induced atrial fibrillation. Stroke risk discussed, and he is on anticoagulation therapy. -  Order echocardiogram to assess cardiac function - Continue anticoagulation therapy with Eliquis  5 mg twice daily - Consider switching to diltiazem  extended release if echocardiogram is normal - Consider metoprolol if echocardiogram shows dysfunction - Discuss potential TEE cardioversion if left ventricular dysfunction is present (Friday)  Alcohol use disorder He consumes a bottle of wine daily, potentially contributing to atrial fibrillation. Recently reduced intake due to stomach issues and is willing to cut back further. Risks of alcohol exacerbating atrial fibrillation discussed. - Advise reduction of alcohol consumption to as little as possible  Elevated creatinine Creatinine levels are elevated, unclear if representing AKI.  Well trend BMP  Mildly dilated ascending aorta CT scan showed mildly dilated ascending aorta. Further evaluation with echocardiogram to assess significance and potential impact on cardiac function. - Evaluate aortic dilation with echocardiogram  Coronary atherosclerosis Aortic atherosclerosis CT scan revealed coronary atherosclerosis with arterial plaque. Significance to be further evaluated with echocardiogram. - lipids pending; LDL goal < 70, on ACA no ASA  Hypomagnesemia Magnesium  levels are low, potentially contributing to cardiac arrhythmias like RVR  Darren Leavens, MD FASE [place] [facility]  [address] [place], [place] [address] [phone]  3:14 PM

## 2024-01-05 NOTE — Progress Notes (Signed)
 [facility] PROGRESS NOTE    Progress Note  Darren Gutierrez  FMW:[id number] DOB: [birth year] DOA: 01/04/2024 PCP: Patient, No Pcp Per     Brief Narrative:   Darren Gutierrez is an 73 y.o. male non-English speaker past medical history significant for diabetes mellitus type 2, essential hypertension hyperlipidemia, alcohol abuse who drinks about 1 bottle of wine every day experience epigastric abdominal pain for the last 3 weeks prior to admission was found to be tachycardic at the PCP and was sent to the ED was found to be in A-fib with RVR, CT angio was negative for PE, CT scan of the abdomen and pelvis was unremarkable.  Was also found to be in acute kidney injury.  Assessment/Plan:   Atrial fibrillation with RVR (HCC): With a chads Vascor greater than 2. Started on IV diltiazem , now rate controlled.  Transition to oral diltiazem . 2D echo is pending. Cardiology consulted.  Abdominal pain: CT scan of the abdomen pelvis unremarkable. Started on Protonix  daily. This is in the setting of daily alcohol use. He relates has not urinated in over 2 days, only had about 400 cc on bladder scan. Continue IV fluids repeat bladder scan.  Alcohol abuse: Drinks about a bottle of wine every day. Will start on Librium  protocol. Also on thiamine  and folate. No signs of withdrawal  Essential hypertension: Blood pressure is controlled on IV diltiazem .  Diabetes mellitus type 2: Hold oral hypoglycemic agents. Last A1c of 6.1., start on sliding scale insulin .  Acute kidney injury: No previous lab will start on IV fluids basic metabolic panel is pending this morning.  Hyperlipidemia:  Started on statins   DVT prophylaxis: IV heparin  Family Communication:none Status is: Inpatient Remains inpatient appropriate because: a fib with RVR    Code Status:     Code Status Orders  (From admission, onward)           Start     Ordered   01/04/24 2320  Full code  Continuous        Question:  By:  Answer:  Consent: discussion documented in EHR   01/04/24 2320           Code Status History     This patient has a current code status but no historical code status.         IV Access:   Peripheral IV   Procedures and diagnostic studies:   CT ABDOMEN PELVIS W CONTRAST Result Date: 01/04/2024 CLINICAL DATA:  Pulmonary embolism (PE) suspected, high prob; Abdominal pain, acute, nonlocalized. Nausea/vomiting and diarrhea. EXAM: CT ANGIOGRAPHY CHEST CT ABDOMEN AND PELVIS WITH CONTRAST TECHNIQUE: Multidetector CT imaging of the chest was performed using the standard protocol during bolus administration of intravenous contrast. Multiplanar CT image reconstructions and MIPs were obtained to evaluate the vascular anatomy. Multidetector CT imaging of the abdomen and pelvis was performed using the standard protocol during bolus administration of intravenous contrast. RADIATION DOSE REDUCTION: This exam was performed according to the departmental dose-optimization program which includes automated exposure control, adjustment of the mA and/or kV according to patient size and/or use of iterative reconstruction technique. CONTRAST:  OMNIPAQUE  IOHEXOL  350 MG/ML SOLN COMPARISON:  None Available. FINDINGS: CTA CHEST FINDINGS Cardiovascular: No evidence of embolism to the proximal subsegmental pulmonary artery level. Normal cardiac size. No pericardial effusion. There is dilated ascending aorta measuring up to 4.2 cm on this exam. Please note this examination was not performed without cardiac gating and therefore the measurement is subject to motion artifact. There  are coronary artery calcifications, in keeping with coronary artery disease. There are also mild peripheral atherosclerotic vascular calcifications of thoracic aorta and its major branches. Mediastinum/Nodes: Visualized thyroid  gland appears grossly unremarkable. No solid / cystic mediastinal masses. There is small amount  of fluid along the ascending aortic arch, likely fluid in the pericardial recess, pericardial cyst or pericardial diverticulum. This is a benign finding. The esophagus is nondistended precluding optimal assessment. No axillary, mediastinal or hilar lymphadenopathy by size criteria. Lungs/Pleura: The central tracheo-bronchial tree is patent. There is mild, smooth, circumferential thickening of the segmental and subsegmental bronchial walls, throughout bilateral lungs, which is nonspecific. Findings are most commonly seen with bronchitis or reactive airway disease, such as asthma. There is frothy material along the right wall of the lower trachea, favored to represent mucous secretions or aspiration. There are patchy areas of linear, plate-like atelectasis and/or scarring throughout bilateral lungs. No mass or consolidation. No pleural effusion or pneumothorax. There is a 3 x 4 mm noncalcified opacity in the lingular segment of left upper lobe (series 13, image 61), favored to represent atelectasis/scarring. No suspicious lung nodule. Musculoskeletal: The visualized soft tissues of the chest wall are grossly unremarkable. No suspicious osseous lesions. There are mild multilevel degenerative changes in the visualized spine. Review of the MIP images confirms the above findings. CT ABDOMEN and PELVIS FINDINGS Hepatobiliary: The liver is normal in size. Non-cirrhotic configuration. No suspicious mass. These is mild diffuse hepatic steatosis. No intrahepatic or extrahepatic bile duct dilation. No calcified gallstones. Normal gallbladder wall thickness. No pericholecystic inflammatory changes. Pancreas: Unremarkable. No pancreatic ductal dilatation or surrounding inflammatory changes. Spleen: Within normal limits. No focal lesion. Adrenals/Urinary Tract: Adrenal glands are unremarkable. No suspicious renal mass. There are partially exophytic single simple cyst in the right kidney measuring up to 2.4 x 2.8 cm. There is a  nearly completely exophytic 2.4 x 3.1 cm cyst arising from the left kidney lower pole, posterolaterally. No nephroureterolithiasis or obstructive uropathy. Unremarkable urinary bladder. Stomach/Bowel: No disproportionate dilation of the small or large bowel loops. No evidence of abnormal bowel wall thickening or inflammatory changes. The appendix is unremarkable. Vascular/Lymphatic: No ascites or pneumoperitoneum. No abdominal or pelvic lymphadenopathy, by size criteria. No aneurysmal dilation of the major abdominal arteries. There are moderate peripheral atherosclerotic vascular calcifications of the aorta and its major branches. Reproductive: Normal size prostate. Symmetric seminal vesicles. Other: There is a tiny fat containing umbilical hernia. The soft tissues and abdominal wall are otherwise unremarkable. Musculoskeletal: No suspicious osseous lesions. There are mild multilevel degenerative changes in the visualized spine. Review of the MIP images confirms the above findings. IMPRESSION: 1. No embolism to the proximal subsegmental pulmonary artery level. 2. No acute inflammatory process identified within the abdomen or pelvis. 3. There is a 3 x 4 mm noncalcified opacity in the lingular segment of left upper lobe, favored to represent atelectasis/scarring. 4. Ascending aorta measures up 4.2 cm on this non cardiac gated exam. 5. Multiple other nonacute observations, as described above. Aortic Atherosclerosis (ICD10-I70.0). Electronically Signed   By: Ree Molt M.D.   On: 01/04/2024 16:51   CT Angio Chest PE W and/or Wo Contrast Result Date: 01/04/2024 CLINICAL DATA:  Pulmonary embolism (PE) suspected, high prob; Abdominal pain, acute, nonlocalized. Nausea/vomiting and diarrhea. EXAM: CT ANGIOGRAPHY CHEST CT ABDOMEN AND PELVIS WITH CONTRAST TECHNIQUE: Multidetector CT imaging of the chest was performed using the standard protocol during bolus administration of intravenous contrast. Multiplanar CT image  reconstructions and MIPs were obtained to  evaluate the vascular anatomy. Multidetector CT imaging of the abdomen and pelvis was performed using the standard protocol during bolus administration of intravenous contrast. RADIATION DOSE REDUCTION: This exam was performed according to the departmental dose-optimization program which includes automated exposure control, adjustment of the mA and/or kV according to patient size and/or use of iterative reconstruction technique. CONTRAST:  OMNIPAQUE  IOHEXOL  350 MG/ML SOLN COMPARISON:  None Available. FINDINGS: CTA CHEST FINDINGS Cardiovascular: No evidence of embolism to the proximal subsegmental pulmonary artery level. Normal cardiac size. No pericardial effusion. There is dilated ascending aorta measuring up to 4.2 cm on this exam. Please note this examination was not performed without cardiac gating and therefore the measurement is subject to motion artifact. There are coronary artery calcifications, in keeping with coronary artery disease. There are also mild peripheral atherosclerotic vascular calcifications of thoracic aorta and its major branches. Mediastinum/Nodes: Visualized thyroid  gland appears grossly unremarkable. No solid / cystic mediastinal masses. There is small amount of fluid along the ascending aortic arch, likely fluid in the pericardial recess, pericardial cyst or pericardial diverticulum. This is a benign finding. The esophagus is nondistended precluding optimal assessment. No axillary, mediastinal or hilar lymphadenopathy by size criteria. Lungs/Pleura: The central tracheo-bronchial tree is patent. There is mild, smooth, circumferential thickening of the segmental and subsegmental bronchial walls, throughout bilateral lungs, which is nonspecific. Findings are most commonly seen with bronchitis or reactive airway disease, such as asthma. There is frothy material along the right wall of the lower trachea, favored to represent mucous secretions or  aspiration. There are patchy areas of linear, plate-like atelectasis and/or scarring throughout bilateral lungs. No mass or consolidation. No pleural effusion or pneumothorax. There is a 3 x 4 mm noncalcified opacity in the lingular segment of left upper lobe (series 13, image 61), favored to represent atelectasis/scarring. No suspicious lung nodule. Musculoskeletal: The visualized soft tissues of the chest wall are grossly unremarkable. No suspicious osseous lesions. There are mild multilevel degenerative changes in the visualized spine. Review of the MIP images confirms the above findings. CT ABDOMEN and PELVIS FINDINGS Hepatobiliary: The liver is normal in size. Non-cirrhotic configuration. No suspicious mass. These is mild diffuse hepatic steatosis. No intrahepatic or extrahepatic bile duct dilation. No calcified gallstones. Normal gallbladder wall thickness. No pericholecystic inflammatory changes. Pancreas: Unremarkable. No pancreatic ductal dilatation or surrounding inflammatory changes. Spleen: Within normal limits. No focal lesion. Adrenals/Urinary Tract: Adrenal glands are unremarkable. No suspicious renal mass. There are partially exophytic single simple cyst in the right kidney measuring up to 2.4 x 2.8 cm. There is a nearly completely exophytic 2.4 x 3.1 cm cyst arising from the left kidney lower pole, posterolaterally. No nephroureterolithiasis or obstructive uropathy. Unremarkable urinary bladder. Stomach/Bowel: No disproportionate dilation of the small or large bowel loops. No evidence of abnormal bowel wall thickening or inflammatory changes. The appendix is unremarkable. Vascular/Lymphatic: No ascites or pneumoperitoneum. No abdominal or pelvic lymphadenopathy, by size criteria. No aneurysmal dilation of the major abdominal arteries. There are moderate peripheral atherosclerotic vascular calcifications of the aorta and its major branches. Reproductive: Normal size prostate. Symmetric seminal  vesicles. Other: There is a tiny fat containing umbilical hernia. The soft tissues and abdominal wall are otherwise unremarkable. Musculoskeletal: No suspicious osseous lesions. There are mild multilevel degenerative changes in the visualized spine. Review of the MIP images confirms the above findings. IMPRESSION: 1. No embolism to the proximal subsegmental pulmonary artery level. 2. No acute inflammatory process identified within the abdomen or pelvis. 3. There is  a 3 x 4 mm noncalcified opacity in the lingular segment of left upper lobe, favored to represent atelectasis/scarring. 4. Ascending aorta measures up 4.2 cm on this non cardiac gated exam. 5. Multiple other nonacute observations, as described above. Aortic Atherosclerosis (ICD10-I70.0). Electronically Signed   By: Ree Molt M.D.   On: 01/04/2024 16:51   DG Chest Portable 1 View Result Date: 01/04/2024 CLINICAL DATA:  Shortness of breath. EXAM: PORTABLE CHEST 1 VIEW COMPARISON:  None Available. FINDINGS: Low lung volume. Moderately elevated left hemidiaphragm. There is blunting of left lateral costophrenic angle, which may suggest trace left pleural effusion. Right lateral costophrenic angle is clear. There are probable atelectatic changes at the left lung base. Bilateral lung fields are otherwise clear. Moderately enlarged cardio-mediastinal silhouette, which is likely accentuated by low lung volume and AP technique. No acute osseous abnormalities. The soft tissues are within normal limits. IMPRESSION: Low lung volume. Probable trace left pleural effusion with left basilar atelectasis. Electronically Signed   By: Ree Molt M.D.   On: 01/04/2024 14:36     Medical Consultants:   None.   Subjective:    Darren Gutierrez no complaints  Objective:    Vitals:   01/04/24 2000 01/04/24 2102 01/04/24 2317 01/05/24 0418  BP: (!) 148/99 (!) 159/114 125/85 (!) 123/95  Pulse: (!) 123 (!) 120 85 66  Resp: (!) 23 20 20 20   Temp:  97.9  F (36.6 C) 98 F (36.7 C) 98 F (36.7 C)  TempSrc:  Oral Oral Oral  SpO2: 97% 95% 96% 93%  Weight:      Height:       SpO2: 93 % O2 Flow Rate (L/min): 2 L/min   Intake/Output Summary (Last 24 hours) at 01/05/2024 0618 Last data filed at 01/05/2024 0417 Gross per 24 hour  Intake 1166.41 ml  Output --  Net 1166.41 ml   Filed Weights   01/04/24 1406  Weight: 94 kg    Exam: General exam: In no acute distress. Respiratory system: Good air movement and clear to auscultation. Cardiovascular system: S1 & S2 heard, RRR. No JVD. Gastrointestinal system: Abdomen is nondistended, soft and nontender.  Extremities: No pedal edema. Skin: No rashes, lesions or ulcers Psychiatry: Judgement and insight appear normal. Mood & affect appropriate.    Data Reviewed:    Labs: Basic Metabolic Panel: Recent Labs  Lab 01/04/24 1440 01/05/24 0001  NA 140 138  K 4.4 4.7  CL 101 106  CO2 23 25  GLUCOSE 106* 119*  BUN 16 16  CREATININE 1.36* 1.33*  CALCIUM  10.0 9.1   GFR Estimated Creatinine Clearance: 61.5 mL/min (A) (by C-G formula based on SCr of 1.33 mg/dL (H)). Liver Function Tests: Recent Labs  Lab 01/04/24 1440 01/05/24 0001  AST 35 24  ALT 36 29  ALKPHOS 54 42  BILITOT 0.4 0.8  PROT 7.4 6.1*  ALBUMIN 4.4 3.4*   Recent Labs  Lab 01/04/24 1440  LIPASE 55*   No results for input(s): AMMONIA in the last 168 hours. Coagulation profile No results for input(s): INR, PROTIME in the last 168 hours. COVID-19 Labs  No results for input(s): DDIMER, FERRITIN, LDH, CRP in the last 72 hours.  No results found for: SARSCOV2NAA  CBC: Recent Labs  Lab 01/04/24 1440 01/05/24 0001  WBC 5.5 6.0  NEUTROABS  --  2.7  HGB 13.7 12.5*  HCT 39.3 36.9*  MCV 93.8 95.6  PLT 232 221   Cardiac Enzymes: No results for input(s): CKTOTAL, CKMB,  CKMBINDEX, TROPONINI in the last 168 hours. BNP (last 3 results) No results for input(s): PROBNP in the last  8760 hours. CBG: Recent Labs  Lab 01/04/24 1443 01/05/24 0611  GLUCAP 108* 126*   D-Dimer: No results for input(s): DDIMER in the last 72 hours. Hgb A1c: Recent Labs    01/05/24 0001  HGBA1C 6.1*   Lipid Profile: No results for input(s): CHOL, HDL, LDLCALC, TRIG, CHOLHDL, LDLDIRECT in the last 72 hours. Thyroid  function studies: Recent Labs    01/05/24 0001  TSH 4.487   Anemia work up: No results for input(s): VITAMINB12, FOLATE, FERRITIN, TIBC, IRON, RETICCTPCT in the last 72 hours. Sepsis Labs: Recent Labs  Lab 01/04/24 1440 01/04/24 1647 01/05/24 0001  WBC 5.5  --  6.0  LATICACIDVEN 2.6* 1.7  --    Microbiology No results found for this or any previous visit (from the past 240 hours).   Medications:    atorvastatin   40 mg Oral Daily   chlordiazePOXIDE   25 mg Oral QID   Followed by   NOREEN ON 01/06/2024] chlordiazePOXIDE   25 mg Oral TID   Followed by   NOREEN ON 01/07/2024] chlordiazePOXIDE   25 mg Oral BH-qamhs   Followed by   NOREEN ON 01/08/2024] chlordiazePOXIDE   25 mg Oral Daily   folic acid   1 mg Oral Daily   insulin  aspart  0-9 Units Subcutaneous TID WC   multivitamin with minerals  1 tablet Oral Daily   pantoprazole  (PROTONIX ) IV  40 mg Intravenous Q24H   thiamine   100 mg Oral Daily   Or   thiamine   100 mg Intravenous Daily   Continuous Infusions:  diltiazem  (CARDIZEM ) infusion 7.5 mg/hr (01/04/24 2225)   heparin  1,200 Units/hr (01/05/24 0113)      LOS: 1 day   Darren Gutierrez  [facility]  01/05/2024, 6:18 AM

## 2024-01-05 NOTE — Progress Notes (Signed)
  Echocardiogram 2D Echocardiogram has been performed.  Darren Gutierrez 01/05/2024, 4:13 PM

## 2024-01-05 NOTE — Progress Notes (Signed)
   01/05/24 1119  Urine Measurement/Characteristics  Urinary Interventions Intermittent/Straight cath  Intermittent/Straight Cath (mL) 675 mL

## 2024-01-05 NOTE — Progress Notes (Addendum)
 PHARMACY - ANTICOAGULATION CONSULT NOTE  Pharmacy Consult for heparin  Indication: atrial fibrillation  Labs: Recent Labs    01/04/24 1440 01/05/24 0001 01/05/24 0107 01/05/24 0942 01/05/24 2153  HGB 13.7 12.5*  --   --   --   HCT 39.3 36.9*  --   --   --   PLT 232 221  --   --   --   APTT  --   --   --   --  33  HEPARINUNFRC  --   --   --  >1.10* 0.88*  CREATININE 1.36* 1.33*  --   --   --   TROPONINIHS  --  24* 25*  --   --    Assessment: 72yo male subtherapeutic on heparin  after rate change, though noted that dosing was based on anti-Xa but pt had gotten a dose of apixaban  on 8/26 pm, which will cause falsely high heparin  levels; no infusion issues or signs of bleeding per RN.  Goal of Therapy:  aPTT 66-102 seconds   Plan:  3000 units heparin  bolus. Increase heparin  infusion by 4 units/kg/hr to 1400 units/hr. Check PTT in 6-8 hours.   Marvetta Dauphin, PharmD, BCPS 01/05/2024 11:29 PM

## 2024-01-06 ENCOUNTER — Other Ambulatory Visit (HOSPITAL_COMMUNITY): Payer: Self-pay

## 2024-01-06 DIAGNOSIS — I4891 Unspecified atrial fibrillation: Secondary | ICD-10-CM | POA: Diagnosis not present

## 2024-01-06 DIAGNOSIS — N179 Acute kidney failure, unspecified: Secondary | ICD-10-CM | POA: Diagnosis not present

## 2024-01-06 DIAGNOSIS — I4892 Unspecified atrial flutter: Secondary | ICD-10-CM | POA: Diagnosis not present

## 2024-01-06 DIAGNOSIS — R7989 Other specified abnormal findings of blood chemistry: Secondary | ICD-10-CM | POA: Diagnosis not present

## 2024-01-06 LAB — CBC
HCT: 32.8 % — ABNORMAL LOW (ref 39.0–52.0)
Hemoglobin: 11.4 g/dL — ABNORMAL LOW (ref 13.0–17.0)
MCH: 32.9 pg (ref 26.0–34.0)
MCHC: 34.8 g/dL (ref 30.0–36.0)
MCV: 94.5 fL (ref 80.0–100.0)
Platelets: 175 K/uL (ref 150–400)
RBC: 3.47 MIL/uL — ABNORMAL LOW (ref 4.22–5.81)
RDW: 12.6 % (ref 11.5–15.5)
WBC: 6.5 K/uL (ref 4.0–10.5)
nRBC: 0 % (ref 0.0–0.2)

## 2024-01-06 LAB — BASIC METABOLIC PANEL WITH GFR
Anion gap: 10 (ref 5–15)
BUN: 16 mg/dL (ref 8–23)
CO2: 23 mmol/L (ref 22–32)
Calcium: 9.1 mg/dL (ref 8.9–10.3)
Chloride: 103 mmol/L (ref 98–111)
Creatinine, Ser: 1.3 mg/dL — ABNORMAL HIGH (ref 0.61–1.24)
GFR, Estimated: 59 mL/min — ABNORMAL LOW (ref >60)
Glucose, Bld: 125 mg/dL — ABNORMAL HIGH (ref 70–99)
Potassium: 3.9 mmol/L (ref 3.5–5.1)
Sodium: 136 mmol/L (ref 135–145)

## 2024-01-06 LAB — LIPID PANEL
Cholesterol: 145 mg/dL (ref 0–200)
HDL: 47 mg/dL (ref >40)
LDL Cholesterol: 82 mg/dL (ref 0–99)
Total CHOL/HDL Ratio: 3.1 ratio
Triglycerides: 79 mg/dL (ref <150)
VLDL: 16 mg/dL (ref 0–40)

## 2024-01-06 LAB — GLUCOSE, CAPILLARY
Glucose-Capillary: 135 mg/dL — ABNORMAL HIGH (ref 70–99)
Glucose-Capillary: 142 mg/dL — ABNORMAL HIGH (ref 70–99)
Glucose-Capillary: 100 mg/dL — ABNORMAL HIGH (ref 70–99)
Glucose-Capillary: 273 mg/dL — ABNORMAL HIGH (ref 70–99)

## 2024-01-06 LAB — MAGNESIUM: Magnesium: 1.5 mg/dL — ABNORMAL LOW (ref 1.7–2.4)

## 2024-01-06 MED ORDER — TAMSULOSIN HCL 0.4 MG PO CAPS
0.4000 mg | ORAL_CAPSULE | Freq: Every day | ORAL | Status: DC
  Administered 2024-01-06 – 2024-01-07 (×2): 0.4 mg via ORAL
  Filled 2024-01-06 (×2): qty 1

## 2024-01-06 MED ORDER — DILTIAZEM HCL ER COATED BEADS 180 MG PO CP24
360.0000 mg | ORAL_CAPSULE | Freq: Every day | ORAL | Status: DC
  Administered 2024-01-06 – 2024-01-07 (×2): 360 mg via ORAL
  Filled 2024-01-06 (×2): qty 2

## 2024-01-06 MED ORDER — DILTIAZEM HCL 30 MG PO TABS: 90.0000 mg | ORAL_TABLET | Freq: Four times a day (QID) | ORAL | Status: DC

## 2024-01-06 MED ORDER — SODIUM CHLORIDE 0.9 % IV SOLN: INTRAVENOUS | Status: AC

## 2024-01-06 MED ORDER — RIVAROXABAN 20 MG PO TABS
20.0000 mg | ORAL_TABLET | Freq: Every day | ORAL | Status: DC
  Administered 2024-01-06: 20 mg via ORAL
  Filled 2024-01-06: qty 1

## 2024-01-06 MED ORDER — MAGNESIUM SULFATE 4 GM/100ML IV SOLN
4.0000 g | Freq: Once | INTRAVENOUS | Status: AC
  Administered 2024-01-06: 4 g via INTRAVENOUS
  Filled 2024-01-06: qty 100

## 2024-01-06 NOTE — Progress Notes (Signed)
 [facility] PROGRESS NOTE    Progress Note  Darren Gutierrez  FMW:[id number] DOB: [birth year] DOA: 01/04/2024 PCP: Patient, No Pcp Per    Brief Narrative:   Darren Gutierrez is an 73 y.o. male non-English speaker past medical history significant for diabetes mellitus type 2, essential hypertension hyperlipidemia, alcohol abuse who drinks about 1 bottle of wine every day experience epigastric abdominal pain for the last 3 weeks prior to admission was found to be tachycardic at the PCP and was sent to the ED was found to be in A-fib with RVR, CT angio was negative for PE, CT scan of the abdomen and pelvis was unremarkable.  Was also found to be in acute kidney injury.  Assessment/Plan:   New onset Atrial fibrillation with RVR (HCC): With a chads Vascor greater than 2. Heart rate still elevated increase diltiazem . 2D echo showed an EF of 60% no regional wall motion abnormality no aortic stenosis dilation of the aortic root about 42 mm Cardiology recommended to continue current management discussed potential TEE cardioversion hopefully tomorrow.  Abdominal pain: CT scan of the abdomen pelvis unremarkable. This is in the setting of daily alcohol use.  Currently on Protonix  twice a day abdominal pain feels better. Had to be in and out cath yesterday started on Flomax . KVO IV fluids IV fluids repeat bladder scan.  Alcohol abuse: Drinks about a bottle of wine every day. Will start on Librium  protocol. Also on thiamine  and folate. No signs of withdrawal  Essential hypertension: Continue oral diltiazem .  Blood pressure is well-controlled.  Diabetes mellitus type 2: Hold oral hypoglycemic agents. Last A1c of 6.1., start on sliding scale insulin .  Acute kidney injury: No previous labs to compare with, continue IV fluids recheck basic metabolic panel tomorrow morning  Hyperlipidemia:  Continue statins.   DVT prophylaxis: IV heparin  Family Communication:none Status is:  Inpatient Remains inpatient appropriate because: a fib with RVR    Code Status:     Code Status Orders  (From admission, onward)           Start     Ordered   01/04/24 2320  Full code  Continuous       Question:  By:  Answer:  Consent: discussion documented in EHR   01/04/24 2320           Code Status History     This patient has a current code status but no historical code status.         IV Access:   Peripheral IV   Procedures and diagnostic studies:   ECHOCARDIOGRAM COMPLETE Result Date: 01/05/2024    ECHOCARDIOGRAM REPORT   Patient Name:   Darren Gutierrez Date of Exam: 01/05/2024 Medical Rec #:  [id number]         Height:       72.0 in Accession #:    [id number]        Weight:       207.2 lb Date of Birth:  04/17/1951        BSA:          2.163 m Patient Age:    72 years          BP:           129/75 mmHg Patient Gender: M                 HR:           95 bpm. Exam Location:  Inpatient Procedure: 2D Echo, Cardiac Doppler  and Color Doppler (Both Spectral and Color            Flow Doppler were utilized during procedure). Indications:    A-Fib  History:        Patient has no prior history of Echocardiogram examinations.                 Arrythmias:Atrial Fibrillation; Risk Factors:Hypertension and                 Former Smoker.  Sonographer:    Juliene Rucks Referring Phys: [id number] ARSHAD N KAKRAKANDY IMPRESSIONS  1. Left ventricular ejection fraction, by estimation, is 60 to 65%. The left ventricle has normal function. The left ventricle has no regional wall motion abnormalities. There is mild concentric left ventricular hypertrophy. Left ventricular diastolic function could not be evaluated.  2. Right ventricular systolic function is normal. The right ventricular size is normal.  3. Left atrial size was mildly dilated.  4. The mitral valve is normal in structure. No evidence of mitral valve regurgitation. No evidence of mitral stenosis.  5. The aortic valve has an  indeterminant number of cusps. Aortic valve regurgitation is not visualized. No aortic stenosis is present.  6. Aortic dilatation noted. There is dilatation of the aortic root, measuring 42 mm.  7. The inferior vena cava is dilated in size with <50% respiratory variability, suggesting right atrial pressure of 15 mmHg. FINDINGS  Left Ventricle: Left ventricular ejection fraction, by estimation, is 60 to 65%. The left ventricle has normal function. The left ventricle has no regional wall motion abnormalities. The left ventricular internal cavity size was normal in size. There is  mild concentric left ventricular hypertrophy. Left ventricular diastolic function could not be evaluated due to atrial fibrillation. Left ventricular diastolic function could not be evaluated. Right Ventricle: The right ventricular size is normal. No increase in right ventricular wall thickness. Right ventricular systolic function is normal. Left Atrium: Left atrial size was mildly dilated. Right Atrium: Right atrial size was normal in size. Pericardium: There is no evidence of pericardial effusion. Mitral Valve: The mitral valve is normal in structure. No evidence of mitral valve regurgitation. No evidence of mitral valve stenosis. Tricuspid Valve: The tricuspid valve is normal in structure. Tricuspid valve regurgitation is not demonstrated. No evidence of tricuspid stenosis. Aortic Valve: The aortic valve has an indeterminant number of cusps. Aortic valve regurgitation is not visualized. No aortic stenosis is present. Pulmonic Valve: The pulmonic valve was normal in structure. Pulmonic valve regurgitation is not visualized. No evidence of pulmonic stenosis. Aorta: Aortic dilatation noted. There is dilatation of the aortic root, measuring 42 mm. Venous: The inferior vena cava is dilated in size with less than 50% respiratory variability, suggesting right atrial pressure of 15 mmHg. IAS/Shunts: No atrial level shunt detected by color flow  Doppler.  LEFT VENTRICLE PLAX 2D LVIDd:         3.80 cm LV PW:         1.20 cm LV IVS:        1.30 cm  Aditya Sabharwal Electronically signed by Ria Commander Signature Date/Time: 01/05/2024/7:18:57 PM    Final    CT ABDOMEN PELVIS W CONTRAST Result Date: 01/04/2024 CLINICAL DATA:  Pulmonary embolism (PE) suspected, high prob; Abdominal pain, acute, nonlocalized. Nausea/vomiting and diarrhea. EXAM: CT ANGIOGRAPHY CHEST CT ABDOMEN AND PELVIS WITH CONTRAST TECHNIQUE: Multidetector CT imaging of the chest was performed using the standard protocol during bolus administration of intravenous contrast. Multiplanar CT image reconstructions  and MIPs were obtained to evaluate the vascular anatomy. Multidetector CT imaging of the abdomen and pelvis was performed using the standard protocol during bolus administration of intravenous contrast. RADIATION DOSE REDUCTION: This exam was performed according to the departmental dose-optimization program which includes automated exposure control, adjustment of the mA and/or kV according to patient size and/or use of iterative reconstruction technique. CONTRAST:  OMNIPAQUE  IOHEXOL  350 MG/ML SOLN COMPARISON:  None Available. FINDINGS: CTA CHEST FINDINGS Cardiovascular: No evidence of embolism to the proximal subsegmental pulmonary artery level. Normal cardiac size. No pericardial effusion. There is dilated ascending aorta measuring up to 4.2 cm on this exam. Please note this examination was not performed without cardiac gating and therefore the measurement is subject to motion artifact. There are coronary artery calcifications, in keeping with coronary artery disease. There are also mild peripheral atherosclerotic vascular calcifications of thoracic aorta and its major branches. Mediastinum/Nodes: Visualized thyroid  gland appears grossly unremarkable. No solid / cystic mediastinal masses. There is small amount of fluid along the ascending aortic arch, likely fluid in the  pericardial recess, pericardial cyst or pericardial diverticulum. This is a benign finding. The esophagus is nondistended precluding optimal assessment. No axillary, mediastinal or hilar lymphadenopathy by size criteria. Lungs/Pleura: The central tracheo-bronchial tree is patent. There is mild, smooth, circumferential thickening of the segmental and subsegmental bronchial walls, throughout bilateral lungs, which is nonspecific. Findings are most commonly seen with bronchitis or reactive airway disease, such as asthma. There is frothy material along the right wall of the lower trachea, favored to represent mucous secretions or aspiration. There are patchy areas of linear, plate-like atelectasis and/or scarring throughout bilateral lungs. No mass or consolidation. No pleural effusion or pneumothorax. There is a 3 x 4 mm noncalcified opacity in the lingular segment of left upper lobe (series 13, image 61), favored to represent atelectasis/scarring. No suspicious lung nodule. Musculoskeletal: The visualized soft tissues of the chest wall are grossly unremarkable. No suspicious osseous lesions. There are mild multilevel degenerative changes in the visualized spine. Review of the MIP images confirms the above findings. CT ABDOMEN and PELVIS FINDINGS Hepatobiliary: The liver is normal in size. Non-cirrhotic configuration. No suspicious mass. These is mild diffuse hepatic steatosis. No intrahepatic or extrahepatic bile duct dilation. No calcified gallstones. Normal gallbladder wall thickness. No pericholecystic inflammatory changes. Pancreas: Unremarkable. No pancreatic ductal dilatation or surrounding inflammatory changes. Spleen: Within normal limits. No focal lesion. Adrenals/Urinary Tract: Adrenal glands are unremarkable. No suspicious renal mass. There are partially exophytic single simple cyst in the right kidney measuring up to 2.4 x 2.8 cm. There is a nearly completely exophytic 2.4 x 3.1 cm cyst arising from the  left kidney lower pole, posterolaterally. No nephroureterolithiasis or obstructive uropathy. Unremarkable urinary bladder. Stomach/Bowel: No disproportionate dilation of the small or large bowel loops. No evidence of abnormal bowel wall thickening or inflammatory changes. The appendix is unremarkable. Vascular/Lymphatic: No ascites or pneumoperitoneum. No abdominal or pelvic lymphadenopathy, by size criteria. No aneurysmal dilation of the major abdominal arteries. There are moderate peripheral atherosclerotic vascular calcifications of the aorta and its major branches. Reproductive: Normal size prostate. Symmetric seminal vesicles. Other: There is a tiny fat containing umbilical hernia. The soft tissues and abdominal wall are otherwise unremarkable. Musculoskeletal: No suspicious osseous lesions. There are mild multilevel degenerative changes in the visualized spine. Review of the MIP images confirms the above findings. IMPRESSION: 1. No embolism to the proximal subsegmental pulmonary artery level. 2. No acute inflammatory process identified within the abdomen  or pelvis. 3. There is a 3 x 4 mm noncalcified opacity in the lingular segment of left upper lobe, favored to represent atelectasis/scarring. 4. Ascending aorta measures up 4.2 cm on this non cardiac gated exam. 5. Multiple other nonacute observations, as described above. Aortic Atherosclerosis (ICD10-I70.0). Electronically Signed   By: Ree Molt M.D.   On: 01/04/2024 16:51   CT Angio Chest PE W and/or Wo Contrast Result Date: 01/04/2024 CLINICAL DATA:  Pulmonary embolism (PE) suspected, high prob; Abdominal pain, acute, nonlocalized. Nausea/vomiting and diarrhea. EXAM: CT ANGIOGRAPHY CHEST CT ABDOMEN AND PELVIS WITH CONTRAST TECHNIQUE: Multidetector CT imaging of the chest was performed using the standard protocol during bolus administration of intravenous contrast. Multiplanar CT image reconstructions and MIPs were obtained to evaluate the vascular  anatomy. Multidetector CT imaging of the abdomen and pelvis was performed using the standard protocol during bolus administration of intravenous contrast. RADIATION DOSE REDUCTION: This exam was performed according to the departmental dose-optimization program which includes automated exposure control, adjustment of the mA and/or kV according to patient size and/or use of iterative reconstruction technique. CONTRAST:  OMNIPAQUE  IOHEXOL  350 MG/ML SOLN COMPARISON:  None Available. FINDINGS: CTA CHEST FINDINGS Cardiovascular: No evidence of embolism to the proximal subsegmental pulmonary artery level. Normal cardiac size. No pericardial effusion. There is dilated ascending aorta measuring up to 4.2 cm on this exam. Please note this examination was not performed without cardiac gating and therefore the measurement is subject to motion artifact. There are coronary artery calcifications, in keeping with coronary artery disease. There are also mild peripheral atherosclerotic vascular calcifications of thoracic aorta and its major branches. Mediastinum/Nodes: Visualized thyroid  gland appears grossly unremarkable. No solid / cystic mediastinal masses. There is small amount of fluid along the ascending aortic arch, likely fluid in the pericardial recess, pericardial cyst or pericardial diverticulum. This is a benign finding. The esophagus is nondistended precluding optimal assessment. No axillary, mediastinal or hilar lymphadenopathy by size criteria. Lungs/Pleura: The central tracheo-bronchial tree is patent. There is mild, smooth, circumferential thickening of the segmental and subsegmental bronchial walls, throughout bilateral lungs, which is nonspecific. Findings are most commonly seen with bronchitis or reactive airway disease, such as asthma. There is frothy material along the right wall of the lower trachea, favored to represent mucous secretions or aspiration. There are patchy areas of linear, plate-like  atelectasis and/or scarring throughout bilateral lungs. No mass or consolidation. No pleural effusion or pneumothorax. There is a 3 x 4 mm noncalcified opacity in the lingular segment of left upper lobe (series 13, image 61), favored to represent atelectasis/scarring. No suspicious lung nodule. Musculoskeletal: The visualized soft tissues of the chest wall are grossly unremarkable. No suspicious osseous lesions. There are mild multilevel degenerative changes in the visualized spine. Review of the MIP images confirms the above findings. CT ABDOMEN and PELVIS FINDINGS Hepatobiliary: The liver is normal in size. Non-cirrhotic configuration. No suspicious mass. These is mild diffuse hepatic steatosis. No intrahepatic or extrahepatic bile duct dilation. No calcified gallstones. Normal gallbladder wall thickness. No pericholecystic inflammatory changes. Pancreas: Unremarkable. No pancreatic ductal dilatation or surrounding inflammatory changes. Spleen: Within normal limits. No focal lesion. Adrenals/Urinary Tract: Adrenal glands are unremarkable. No suspicious renal mass. There are partially exophytic single simple cyst in the right kidney measuring up to 2.4 x 2.8 cm. There is a nearly completely exophytic 2.4 x 3.1 cm cyst arising from the left kidney lower pole, posterolaterally. No nephroureterolithiasis or obstructive uropathy. Unremarkable urinary bladder. Stomach/Bowel: No disproportionate dilation of  the small or large bowel loops. No evidence of abnormal bowel wall thickening or inflammatory changes. The appendix is unremarkable. Vascular/Lymphatic: No ascites or pneumoperitoneum. No abdominal or pelvic lymphadenopathy, by size criteria. No aneurysmal dilation of the major abdominal arteries. There are moderate peripheral atherosclerotic vascular calcifications of the aorta and its major branches. Reproductive: Normal size prostate. Symmetric seminal vesicles. Other: There is a tiny fat containing umbilical  hernia. The soft tissues and abdominal wall are otherwise unremarkable. Musculoskeletal: No suspicious osseous lesions. There are mild multilevel degenerative changes in the visualized spine. Review of the MIP images confirms the above findings. IMPRESSION: 1. No embolism to the proximal subsegmental pulmonary artery level. 2. No acute inflammatory process identified within the abdomen or pelvis. 3. There is a 3 x 4 mm noncalcified opacity in the lingular segment of left upper lobe, favored to represent atelectasis/scarring. 4. Ascending aorta measures up 4.2 cm on this non cardiac gated exam. 5. Multiple other nonacute observations, as described above. Aortic Atherosclerosis (ICD10-I70.0). Electronically Signed   By: Ree Molt M.D.   On: 01/04/2024 16:51   DG Chest Portable 1 View Result Date: 01/04/2024 CLINICAL DATA:  Shortness of breath. EXAM: PORTABLE CHEST 1 VIEW COMPARISON:  None Available. FINDINGS: Low lung volume. Moderately elevated left hemidiaphragm. There is blunting of left lateral costophrenic angle, which may suggest trace left pleural effusion. Right lateral costophrenic angle is clear. There are probable atelectatic changes at the left lung base. Bilateral lung fields are otherwise clear. Moderately enlarged cardio-mediastinal silhouette, which is likely accentuated by low lung volume and AP technique. No acute osseous abnormalities. The soft tissues are within normal limits. IMPRESSION: Low lung volume. Probable trace left pleural effusion with left basilar atelectasis. Electronically Signed   By: Ree Molt M.D.   On: 01/04/2024 14:36     Medical Consultants:   None.   Subjective:    Darren Gutierrez no complains  Objective:    Vitals:   01/05/24 1619 01/05/24 1936 01/05/24 2258 01/05/24 2300  BP: 126/75 122/79 115/67 115/67  Pulse: 62 62 84 81  Resp: (!) 23 18 20 20   Temp:  (!) 97.5 F (36.4 C)  97.7 F (36.5 C)  TempSrc:  Oral  Oral  SpO2: 94% (!) 89% (!)  89% (!) 89%  Weight:      Height:       SpO2: (!) 89 % O2 Flow Rate (L/min): 2 L/min   Intake/Output Summary (Last 24 hours) at 01/06/2024 0808 Last data filed at 01/06/2024 0002 Gross per 24 hour  Intake 737.71 ml  Output 675 ml  Net 62.71 ml   Filed Weights   01/04/24 1406  Weight: 94 kg    Exam: General exam: In no acute distress. Respiratory system: Good air movement and clear to auscultation. Cardiovascular system: S1 & S2 heard, RRR. No JVD. Gastrointestinal system: Abdomen is nondistended, soft and nontender.  Extremities: No pedal edema. Skin: No rashes, lesions or ulcers Psychiatry: Judgement and insight appear normal. Mood & affect appropriate.  Data Reviewed:    Labs: Basic Metabolic Panel: Recent Labs  Lab 01/04/24 1440 01/05/24 0001 01/05/24 0107  NA 140 138  --   K 4.4 4.7  --   CL 101 106  --   CO2 23 25  --   GLUCOSE 106* 119*  --   BUN 16 16  --   CREATININE 1.36* 1.33*  --   CALCIUM  10.0 9.1  --   MG  --   --  1.4*   GFR Estimated Creatinine Clearance: 61.5 mL/min (A) (by C-G formula based on SCr of 1.33 mg/dL (H)). Liver Function Tests: Recent Labs  Lab 01/04/24 1440 01/05/24 0001  AST 35 24  ALT 36 29  ALKPHOS 54 42  BILITOT 0.4 0.8  PROT 7.4 6.1*  ALBUMIN 4.4 3.4*   Recent Labs  Lab 01/04/24 1440  LIPASE 55*   No results for input(s): AMMONIA in the last 168 hours. Coagulation profile No results for input(s): INR, PROTIME in the last 168 hours. COVID-19 Labs  No results for input(s): DDIMER, FERRITIN, LDH, CRP in the last 72 hours.  No results found for: SARSCOV2NAA  CBC: Recent Labs  Lab 01/04/24 1440 01/05/24 0001  WBC 5.5 6.0  NEUTROABS  --  2.7  HGB 13.7 12.5*  HCT 39.3 36.9*  MCV 93.8 95.6  PLT 232 221   Cardiac Enzymes: No results for input(s): CKTOTAL, CKMB, CKMBINDEX, TROPONINI in the last 168 hours. BNP (last 3 results) No results for input(s): PROBNP in the last 8760  hours. CBG: Recent Labs  Lab 01/05/24 0611 01/05/24 1238 01/05/24 1621 01/05/24 2106 01/06/24 0601  GLUCAP 126* 121* 174* 106* 135*   D-Dimer: No results for input(s): DDIMER in the last 72 hours. Hgb A1c: Recent Labs    01/05/24 0001  HGBA1C 6.1*   Lipid Profile: Recent Labs    01/06/24 0321  CHOL 145  HDL 47  LDLCALC 82  TRIG 79  CHOLHDL 3.1   Thyroid  function studies: Recent Labs    01/05/24 0001  TSH 4.487   Anemia work up: No results for input(s): VITAMINB12, FOLATE, FERRITIN, TIBC, IRON, RETICCTPCT in the last 72 hours. Sepsis Labs: Recent Labs  Lab 01/04/24 1440 01/04/24 1647 01/05/24 0001  WBC 5.5  --  6.0  LATICACIDVEN 2.6* 1.7  --    Microbiology Recent Results (from the past 240 hours)  Blood culture (routine x 2)     Status: None (Preliminary result)   Collection Time: 01/04/24  2:16 PM   Specimen: BLOOD  Result Value Ref Range Status   Specimen Description   Final    BLOOD RIGHT ANTECUBITAL Performed at [facility], [address] [place], [place] [address]    Special Requests   Final    BOTTLES DRAWN AEROBIC AND ANAEROBIC Blood Culture adequate volume Performed at [facility], [address] [place], [place] [address]    Culture   Final    NO GROWTH < 24 HOURS Performed at [facility], [address], [place], [place] [address]    Report Status PENDING  Incomplete  Blood culture (routine x 2)     Status: None (Preliminary result)   Collection Time: 01/04/24  2:21 PM   Specimen: BLOOD  Result Value Ref Range Status   Specimen Description   Final    BLOOD LEFT ANTECUBITAL Performed at [facility], [address] [place], [place] [address]    Special Requests   Final    BOTTLES DRAWN AEROBIC AND ANAEROBIC Blood Culture adequate volume Performed at [facility], [address] [place], [place] [address]    Culture   Final    NO GROWTH < 24 HOURS Performed at  [facility], [address], [place], [place] [address]    Report Status PENDING  Incomplete     Medications:    atorvastatin   40 mg Oral Daily  chlordiazePOXIDE   25 mg Oral TID   Followed by   NOREEN ON 01/07/2024] chlordiazePOXIDE   25 mg Oral BH-qamhs   Followed by   NOREEN ON 01/08/2024] chlordiazePOXIDE   25 mg Oral Daily   diltiazem   60 mg Oral Q6H   folic acid   1 mg Oral Daily   insulin  aspart  0-9 Units Subcutaneous TID WC   multivitamin with minerals  1 tablet Oral Daily   pantoprazole  (PROTONIX ) IV  40 mg Intravenous Q24H   tamsulosin   0.4 mg Oral QPC breakfast   thiamine   100 mg Oral Daily   Or   thiamine   100 mg Intravenous Daily   Continuous Infusions:  diltiazem  (CARDIZEM ) infusion Stopped (01/05/24 0802)   heparin  1,400 Units/hr (01/06/24 0335)      LOS: 2 days   Erle Odell Castor  [facility]  01/06/2024, 8:08 AM

## 2024-01-06 NOTE — Progress Notes (Signed)
   01/06/24 1330  Urine Measurement/Characteristics  Bladder Scan Volume (mL) 201 mL   MD notified

## 2024-01-06 NOTE — Progress Notes (Signed)
 PHARMACY - ANTICOAGULATION CONSULT NOTE  Pharmacy Consult for Heparin  > Xarelto  Indication: atrial fibrillation  No Known Allergies  Patient Measurements: Height: 6' (182.9 cm) Weight: 94 kg (207 lb 3.7 oz) IBW/kg (Calculated) : 77.6 HEPARIN  DW (KG): 94  Vital Signs: Temp: 97.5 F (36.4 C) (08/28 0903) Temp Source: Oral (08/28 0903) BP: 115/67 (08/27 2300) Pulse Rate: 81 (08/27 2300)  Labs: Recent Labs    01/04/24 1440 01/05/24 0001 01/05/24 0107 01/05/24 0942 01/05/24 2153  HGB 13.7 12.5*  --   --   --   HCT 39.3 36.9*  --   --   --   PLT 232 221  --   --   --   APTT  --   --   --   --  33  HEPARINUNFRC  --   --   --  >1.10* 0.88*  CREATININE 1.36* 1.33*  --   --   --   TROPONINIHS  --  24* 25*  --   --     Estimated Creatinine Clearance: 61.5 mL/min (A) (by C-G formula based on SCr of 1.33 mg/dL (H)).   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    Assessment:  73 y.o. male with medical history significant for alcohol abuse not on anticoagulation PTA who presents with new atrial fibrillation with RVR. Pharmacy consulted to start heparin .  Pharmacy asked to transition heparin  to Xarelto  today.  Goal of Therapy:  Heparin  level 0.3-0.7 units/ml Monitor platelets by anticoagulation protocol: Yes   Plan:  At noon, will stop IV heparin  and start Xarelto  20 mg daily. Copay check Xarelto  $12.15  Harlene Barlow, Berdine JONETTA CORP, [facility] Clinical Pharmacist  01/06/2024 10:44 AM   [facility] pharmacy phone numbers are listed on [email or web address]

## 2024-01-06 NOTE — Discharge Instructions (Signed)
Information on my medicine - XARELTO® (Rivaroxaban) ° °This medication education was reviewed with me or my healthcare representative as part of my discharge preparation.  The pharmacist that spoke with me during my hospital stay was:  Carney, Draylen Lobue C, RPH ° °Why was Xarelto® prescribed for you? °Xarelto® was prescribed for you to reduce the risk of a blood clot forming that can cause a stroke if you have a medical condition called atrial fibrillation (a type of irregular heartbeat). ° °What do you need to know about xarelto® ? °Take your Xarelto® ONCE DAILY at the same time every day with your evening meal. °If you have difficulty swallowing the tablet whole, you may crush it and mix in applesauce just prior to taking your dose. ° °Take Xarelto® exactly as prescribed by your doctor and DO NOT stop taking Xarelto® without talking to the doctor who prescribed the medication.  Stopping without other stroke prevention medication to take the place of Xarelto® may increase your risk of developing a clot that causes a stroke.  Refill your prescription before you run out. ° °After discharge, you should have regular check-up appointments with your healthcare provider that is prescribing your Xarelto®.  In the future your dose may need to be changed if your kidney function or weight changes by a significant amount. ° °What do you do if you miss a dose? °If you are taking Xarelto® ONCE DAILY and you miss a dose, take it as soon as you remember on the same day then continue your regularly scheduled once daily regimen the next day. Do not take two doses of Xarelto® at the same time or on the same day.  ° °Important Safety Information °A possible side effect of Xarelto® is bleeding. You should call your healthcare provider right away if you experience any of the following: °? Bleeding from an injury or your nose that does not stop. °? Unusual colored urine (red or dark brown) or unusual colored stools (red or  black). °? Unusual bruising for unknown reasons. °? A serious fall or if you hit your head (even if there is no bleeding). ° °Some medicines may interact with Xarelto® and might increase your risk of bleeding while on Xarelto®. To help avoid this, consult your healthcare provider or pharmacist prior to using any new prescription or non-prescription medications, including herbals, vitamins, non-steroidal anti-inflammatory drugs (NSAIDs) and supplements. ° °This website has more information on Xarelto®: www.xarelto.com. ° °

## 2024-01-06 NOTE — Progress Notes (Signed)
 Verbal order from Dr. Odell for regular diet, And to make pt. NPO at midnight

## 2024-01-06 NOTE — Progress Notes (Signed)
   01/06/24 1730  Urine Measurement/Characteristics  Bladder Scan Volume (mL) 241 mL   MD notified

## 2024-01-06 NOTE — Progress Notes (Signed)
 Progress Note  Patient Name: Darren Gutierrez Date of Encounter: 01/06/2024 Primary Cardiologist: Stanly DELENA Leavens, MD   Subjective   Overnight new events. Patient notes no CP, SOB, Palpitations. When asked with translator if he would like me to talk with his daughter or son, he responded I don't know.  Vital Signs    Vitals:   01/05/24 1936 01/05/24 2258 01/05/24 2300 01/06/24 0903  BP: 122/79 115/67 115/67   Pulse: 62 84 81   Resp: 18 20 20    Temp: (!) 97.5 F (36.4 C)  97.7 F (36.5 C) (!) 97.5 F (36.4 C)  TempSrc: Oral  Oral Oral  SpO2: (!) 89% (!) 89% (!) 89% 92%  Weight:      Height:        Intake/Output Summary (Last 24 hours) at 01/06/2024 1049 Last data filed at 01/06/2024 0002 Gross per 24 hour  Intake 137.71 ml  Output 675 ml  Net -537.29 ml   Filed Weights   01/04/24 1406  Weight: 94 kg    Physical Exam   GEN: No acute distress.   Neck: No JVD Cardiac: No rubs, or gallops. IRIR tachycardia Respiratory: Clear to auscultation bilaterally. Now off O2 GI: Soft, nontender, non-distended  MS: No edema  Labs   Telemetry: AF rates ~ 100s   Chemistry Recent Labs  Lab 01/04/24 1440 01/05/24 0001  NA 140 138  K 4.4 4.7  CL 101 106  CO2 23 25  GLUCOSE 106* 119*  BUN 16 16  CREATININE 1.36* 1.33*  CALCIUM  10.0 9.1  PROT 7.4 6.1*  ALBUMIN 4.4 3.4*  AST 35 24  ALT 36 29  ALKPHOS 54 42  BILITOT 0.4 0.8  GFRNONAA 56* 58*  ANIONGAP 16* 7     Hematology Recent Labs  Lab 01/04/24 1440 01/05/24 0001  WBC 5.5 6.0  RBC 4.19* 3.86*  HGB 13.7 12.5*  HCT 39.3 36.9*  MCV 93.8 95.6  MCH 32.7 32.4  MCHC 34.9 33.9  RDW 12.6 12.7  PLT 232 221    Cardiac Studies   Cardiac Studies & Procedures   ______________________________________________________________________________________________     ECHOCARDIOGRAM  ECHOCARDIOGRAM COMPLETE 01/05/2024  Narrative ECHOCARDIOGRAM REPORT    Patient Name:   Darren Gutierrez Date of  Exam: 01/05/2024 Medical Rec #:  [id number]         Height:       72.0 in Accession #:    [id number]        Weight:       207.2 lb Date of Birth:  05-25-1950        BSA:          2.163 m Patient Age:    72 years          BP:           129/75 mmHg Patient Gender: M                 HR:           95 bpm. Exam Location:  Inpatient  Procedure: 2D Echo, Cardiac Doppler and Color Doppler (Both Spectral and Color Flow Doppler were utilized during procedure).  Indications:    A-Fib  History:        Patient has no prior history of Echocardiogram examinations. Arrythmias:Atrial Fibrillation; Risk Factors:Hypertension and Former Smoker.  Sonographer:    Juliene Rucks Referring Phys: [phone] ARSHAD N KAKRAKANDY  IMPRESSIONS   1. Left ventricular ejection fraction, by estimation, is 60 to 65%.  The left ventricle has normal function. The left ventricle has no regional wall motion abnormalities. There is mild concentric left ventricular hypertrophy. Left ventricular diastolic function could not be evaluated. 2. Right ventricular systolic function is normal. The right ventricular size is normal. 3. Left atrial size was mildly dilated. 4. The mitral valve is normal in structure. No evidence of mitral valve regurgitation. No evidence of mitral stenosis. 5. The aortic valve has an indeterminant number of cusps. Aortic valve regurgitation is not visualized. No aortic stenosis is present. 6. Aortic dilatation noted. There is dilatation of the aortic root, measuring 42 mm. 7. The inferior vena cava is dilated in size with <50% respiratory variability, suggesting right atrial pressure of 15 mmHg.  FINDINGS Left Ventricle: Left ventricular ejection fraction, by estimation, is 60 to 65%. The left ventricle has normal function. The left ventricle has no regional wall motion abnormalities. The left ventricular internal cavity size was normal in size. There is mild concentric left ventricular hypertrophy. Left  ventricular diastolic function could not be evaluated due to atrial fibrillation. Left ventricular diastolic function could not be evaluated.  Right Ventricle: The right ventricular size is normal. No increase in right ventricular wall thickness. Right ventricular systolic function is normal.  Left Atrium: Left atrial size was mildly dilated.  Right Atrium: Right atrial size was normal in size.  Pericardium: There is no evidence of pericardial effusion.  Mitral Valve: The mitral valve is normal in structure. No evidence of mitral valve regurgitation. No evidence of mitral valve stenosis.  Tricuspid Valve: The tricuspid valve is normal in structure. Tricuspid valve regurgitation is not demonstrated. No evidence of tricuspid stenosis.  Aortic Valve: The aortic valve has an indeterminant number of cusps. Aortic valve regurgitation is not visualized. No aortic stenosis is present.  Pulmonic Valve: The pulmonic valve was normal in structure. Pulmonic valve regurgitation is not visualized. No evidence of pulmonic stenosis.  Aorta: Aortic dilatation noted. There is dilatation of the aortic root, measuring 42 mm.  Venous: The inferior vena cava is dilated in size with less than 50% respiratory variability, suggesting right atrial pressure of 15 mmHg.  IAS/Shunts: No atrial level shunt detected by color flow Doppler.   LEFT VENTRICLE PLAX 2D LVIDd:         3.80 cm LV PW:         1.20 cm LV IVS:        1.30 cm   Aditya Sabharwal Electronically signed by Ria Commander Signature Date/Time: 01/05/2024/7:18:57 PM    Final          ______________________________________________________________________________________________        Assessment & Plan   Atrial fibrillation, new onset alcohol mediated - transitioning to diltiazem  360 mg and Xarelto  today - SDM with patient; he will try to at least drop down to a glass of wine a day; we recommend complete cessation - given  persistent planned alcohol use, normal LVEF, and no symptoms, I plan a rate control strategy and outpatient evaluation of rhythm control - CHADSVASC 3  HTN with DM - as per primary  AKI is resolving - BMP planned for 01/07/24 - at that time if there are questions about rate control plan, we can discussion with Dr. Erle; I do worry long term about rhythm control strategy with alcohol use   For questions or updates, please contact [facility] Please consult [email or web address] for contact info under Cardiology/STEMI.      Stanly Leavens, MD FASE [facility] Cardiologist [place]  [facility]  [address] [place], [place] [address] [phone]  10:49 AM

## 2024-01-06 NOTE — Plan of Care (Signed)
  Problem: Clinical Measurements: Goal: Will remain free from infection Outcome: Progressing   Problem: Activity: Goal: Risk for activity intolerance will decrease Outcome: Progressing   

## 2024-01-06 NOTE — TOC Benefit Eligibility Note (Signed)
 Pharmacy Patient Advocate Encounter  Insurance verification completed.    The patient is insured through [employer] Part [employer]. Patient has Medicare and is not eligible for a copay card, but may be able to apply for patient assistance or Medicare RX Payment Plan (Patient Must reach out to their plan, if eligible for payment plan), if available.    Ran test claim for Xarelto  20mg  and the current 30 day co-pay is $12.15.   This test claim was processed through [facility]- copay amounts may vary at other pharmacies due to pharmacy/plan contracts, or as the patient moves through the different stages of their insurance plan.

## 2024-01-07 ENCOUNTER — Other Ambulatory Visit (HOSPITAL_COMMUNITY): Payer: Self-pay

## 2024-01-07 DIAGNOSIS — I4892 Unspecified atrial flutter: Secondary | ICD-10-CM | POA: Diagnosis not present

## 2024-01-07 DIAGNOSIS — N179 Acute kidney failure, unspecified: Secondary | ICD-10-CM | POA: Diagnosis not present

## 2024-01-07 DIAGNOSIS — I4891 Unspecified atrial fibrillation: Secondary | ICD-10-CM | POA: Diagnosis not present

## 2024-01-07 LAB — BASIC METABOLIC PANEL WITH GFR
Anion gap: 10 (ref 5–15)
BUN: 18 mg/dL (ref 8–23)
CO2: 23 mmol/L (ref 22–32)
Calcium: 8.7 mg/dL — ABNORMAL LOW (ref 8.9–10.3)
Chloride: 103 mmol/L (ref 98–111)
Creatinine, Ser: 1.13 mg/dL (ref 0.61–1.24)
GFR, Estimated: >60 mL/min (ref >60)
Glucose, Bld: 117 mg/dL — ABNORMAL HIGH (ref 70–99)
Potassium: 3.7 mmol/L (ref 3.5–5.1)
Sodium: 136 mmol/L (ref 135–145)

## 2024-01-07 LAB — GLUCOSE, CAPILLARY: Glucose-Capillary: 121 mg/dL — ABNORMAL HIGH (ref 70–99)

## 2024-01-07 LAB — CBC
HCT: 29.8 % — ABNORMAL LOW (ref 39.0–52.0)
Hemoglobin: 10.5 g/dL — ABNORMAL LOW (ref 13.0–17.0)
MCH: 33.1 pg (ref 26.0–34.0)
MCHC: 35.2 g/dL (ref 30.0–36.0)
MCV: 94 fL (ref 80.0–100.0)
Platelets: 150 K/uL (ref 150–400)
RBC: 3.17 MIL/uL — ABNORMAL LOW (ref 4.22–5.81)
RDW: 12.4 % (ref 11.5–15.5)
WBC: 5.3 K/uL (ref 4.0–10.5)
nRBC: 0 % (ref 0.0–0.2)

## 2024-01-07 LAB — MAGNESIUM: Magnesium: 1.9 mg/dL (ref 1.7–2.4)

## 2024-01-07 MED ORDER — RIVAROXABAN (XARELTO) VTE STARTER PACK (15 & 20 MG)
ORAL_TABLET | ORAL | 0 refills | Status: DC
  Filled 2024-01-07: qty 51, fill #0

## 2024-01-07 MED ORDER — DILTIAZEM HCL ER COATED BEADS 360 MG PO CP24
360.0000 mg | ORAL_CAPSULE | Freq: Every day | ORAL | 1 refills | Status: DC
  Filled 2024-01-07: qty 30, 30d supply, fill #0

## 2024-01-07 MED ORDER — PANTOPRAZOLE SODIUM 40 MG PO TBEC
40.0000 mg | DELAYED_RELEASE_TABLET | Freq: Every day | ORAL | 1 refills | Status: AC
  Filled 2024-01-07: qty 30, 30d supply, fill #0

## 2024-01-07 MED ORDER — TAMSULOSIN HCL 0.4 MG PO CAPS
0.4000 mg | ORAL_CAPSULE | Freq: Every day | ORAL | 1 refills | Status: AC
  Filled 2024-01-07: qty 30, 30d supply, fill #0

## 2024-01-07 MED ORDER — RIVAROXABAN 20 MG PO TABS
20.0000 mg | ORAL_TABLET | Freq: Every day | ORAL | 3 refills | Status: DC
  Filled 2024-01-07: qty 30, 30d supply, fill #0

## 2024-01-07 NOTE — Progress Notes (Signed)
 DC order noted per MD. DC RN at bedside. Patient observed sitting up dressed agreeable with discharge. Dtr called and informed of discharge plan. AVS printed/reviewed. PIV removed. CP/Edu resolved. No home meds. Pending TOC meds to be picked up per primary RN. Patient determined not discharge lounge appropriate, patient to remain upstairs until dtr arrives. CCMD notified of dc, telemonitor returned to front desk.

## 2024-01-07 NOTE — Discharge Summary (Signed)
 Physician Discharge Summary  Salvatore Guinther FMW:[id number] DOB: [birth year] DOA: 01/04/2024  PCP: Patient, No Pcp Per  Admit date: 01/04/2024 Discharge date: 01/07/2024  Admitted From: Home Disposition:  Home  Recommendations for Outpatient Follow-up:  Follow up with EP in 1-2 weeks Please obtain BMP/CBC in one week   Home Health:No Equipment/Devices:None  Discharge Condition:Stable CODE STATUS:Full Diet recommendation: Heart Healthy   Brief/Interim Summary:  73 y.o. male non-English speaker past medical history significant for diabetes mellitus type 2, essential hypertension hyperlipidemia, alcohol abuse who drinks about 1 bottle of wine every day experience epigastric abdominal pain for the last 3 weeks prior to admission was found to be tachycardic at the PCP and was sent to the ED was found to be in A-fib with RVR, CT angio was negative for PE, CT scan of the abdomen and pelvis was unremarkable.  Was also found to be in acute kidney injury.   Discharge Diagnoses:  Principal Problem:   Atrial fibrillation with RVR (HCC) Active Problems:   Diabetes mellitus (HCC)   Essential hypertension, benign   Pure hypercholesterolemia   Abdominal pain   Atrial fibrillation with rapid ventricular response (HCC)   Elevated troponin   Acute renal failure (ARF) (HCC)  New onset A-fib with RVR: In the setting of alcohol abuse, with a chest Vascor greater than 2. He was placed on IV diltiazem , 2D echo was done that showed no regional wall motion abnormality EF is 60%. Cardiology was consulted recommended rate control and anticoagulation. He will follow-up with EP as an outpatient.  Abdominal pain: CT scan of the abdomen pelvis was unremarkable. He was started on Protonix  his abdominal pain improved he was able to tolerate his diet. Follow-up with PCP as an outpatient.  Alcohol abuse: He drinks about about a bottle of wine every day. No signs of withdrawal he was started on thiamine   and folate.  Essential hypertension: Well-controlled diltiazem  continue current regimen.  Diabetes mellitus type 2: Oral hypoglycemic agents were held A1c was 6.1. He required minimal insulin . No changes made to his medication.  Acute kidney injury: Likely hemodynamically mediated creatinine returned to baseline with IV fluid resuscitation.  Hyperlipidemia: Continue statins  Discharge Instructions  Discharge Instructions     Diet - low sodium heart healthy   Complete by: As directed    Increase activity slowly   Complete by: As directed       Allergies as of 01/07/2024   No Known Allergies      Medication List     STOP taking these medications    meloxicam  15 MG tablet Commonly known as: MOBIC        TAKE these medications    atorvastatin  40 MG tablet Commonly known as: LIPITOR TAKE 1 TABLET BY MOUTH ONCE DAILY   CHOLECALCIFEROL PO Take 1 tablet by mouth daily. Dosage unknown   diltiazem  360 MG 24 hr capsule Commonly known as: CARDIZEM  CD Take 1 capsule (360 mg total) by mouth daily.   glipiZIDE 2.5 MG 24 hr tablet Commonly known as: GLUCOTROL XL Take 2.5 mg by mouth daily with breakfast.   lisinopril -hydrochlorothiazide  20-25 MG tablet Commonly known as: ZESTORETIC  Take 1 tablet by mouth daily.   metFORMIN  1000 MG tablet Commonly known as: GLUCOPHAGE  Take 1,000 mg by mouth 2 (two) times daily with a meal.   Rivaroxaban  Starter Pack (15 mg and 20 mg) Commonly known as: XARELTO  STARTER PACK Follow package directions: Take one 15mg  tablet by mouth twice a day. On day 22, switch  to one 20mg  tablet once a day. Take with food.   tamsulosin  0.4 MG Caps capsule Commonly known as: FLOMAX  Take 1 capsule (0.4 mg total) by mouth daily after breakfast.        Follow-up Information     [facility] [place] Follow up.   Specialty: Cardiology Why: Follow-up scheduled with Daril Kicks, PA-C in the [facility] at this address on Thursday Jan 27, 2024 at 1:30 PM. Arrive 15 minutes prior to appointment to check in. Contact information: [address] [place]  [id number] [phone]               No Known Allergies  Consultations: Cardiology   Procedures/Studies: ECHOCARDIOGRAM COMPLETE Result Date: 01/05/2024    ECHOCARDIOGRAM REPORT   Patient Name:   [facility] [place] Date of Exam: 01/05/2024 Medical Rec #:  [id number]         Height:       72.0 in Accession #:    [id number]        Weight:       207.2 lb Date of Birth:  01-24-1951        BSA:          2.163 m Patient Age:    72 years          BP:           129/75 mmHg Patient Gender: M                 HR:           95 bpm. Exam Location:  Inpatient Procedure: 2D Echo, Cardiac Doppler and Color Doppler (Both Spectral and Color            Flow Doppler were utilized during procedure). Indications:    A-Fib  History:        Patient has no prior history of Echocardiogram examinations.                 Arrythmias:Atrial Fibrillation; Risk Factors:Hypertension and                 Former Smoker.  Sonographer:    Juliene Rucks Referring Phys: [phone] ARSHAD N KAKRAKANDY IMPRESSIONS  1. Left ventricular ejection fraction, by estimation, is 60 to 65%. The left ventricle has normal function. The left ventricle has no regional wall motion abnormalities. There is mild concentric left ventricular hypertrophy. Left ventricular diastolic function could not be evaluated.  2. Right ventricular systolic function is normal. The right ventricular size is normal.  3. Left atrial size was mildly dilated.  4. The mitral valve is normal in structure. No evidence of mitral valve regurgitation. No evidence of mitral stenosis.  5. The aortic valve has an indeterminant number of cusps. Aortic valve regurgitation is not visualized. No aortic stenosis is present.  6. Aortic dilatation noted. There is dilatation of the aortic root, measuring 42 mm.  7.  The inferior vena cava is dilated in size with <50% respiratory variability, suggesting right atrial pressure of 15 mmHg. FINDINGS  Left Ventricle: Left ventricular ejection fraction, by estimation, is 60 to 65%. The left ventricle has normal function. The left ventricle has no regional wall motion abnormalities. The left ventricular internal cavity size was normal in size. There is  mild concentric left ventricular hypertrophy. Left ventricular diastolic function could not be evaluated due to atrial fibrillation. Left ventricular diastolic function could not  be evaluated. Right Ventricle: The right ventricular size is normal. No increase in right ventricular wall thickness. Right ventricular systolic function is normal. Left Atrium: Left atrial size was mildly dilated. Right Atrium: Right atrial size was normal in size. Pericardium: There is no evidence of pericardial effusion. Mitral Valve: The mitral valve is normal in structure. No evidence of mitral valve regurgitation. No evidence of mitral valve stenosis. Tricuspid Valve: The tricuspid valve is normal in structure. Tricuspid valve regurgitation is not demonstrated. No evidence of tricuspid stenosis. Aortic Valve: The aortic valve has an indeterminant number of cusps. Aortic valve regurgitation is not visualized. No aortic stenosis is present. Pulmonic Valve: The pulmonic valve was normal in structure. Pulmonic valve regurgitation is not visualized. No evidence of pulmonic stenosis. Aorta: Aortic dilatation noted. There is dilatation of the aortic root, measuring 42 mm. Venous: The inferior vena cava is dilated in size with less than 50% respiratory variability, suggesting right atrial pressure of 15 mmHg. IAS/Shunts: No atrial level shunt detected by color flow Doppler.  LEFT VENTRICLE PLAX 2D LVIDd:         3.80 cm LV PW:         1.20 cm LV IVS:        1.30 cm  Aditya Sabharwal Electronically signed by Ria Commander Signature Date/Time: 01/05/2024/7:18:57  PM    Final    CT ABDOMEN PELVIS W CONTRAST Result Date: 01/04/2024 CLINICAL DATA:  Pulmonary embolism (PE) suspected, high prob; Abdominal pain, acute, nonlocalized. Nausea/vomiting and diarrhea. EXAM: CT ANGIOGRAPHY CHEST CT ABDOMEN AND PELVIS WITH CONTRAST TECHNIQUE: Multidetector CT imaging of the chest was performed using the standard protocol during bolus administration of intravenous contrast. Multiplanar CT image reconstructions and MIPs were obtained to evaluate the vascular anatomy. Multidetector CT imaging of the abdomen and pelvis was performed using the standard protocol during bolus administration of intravenous contrast. RADIATION DOSE REDUCTION: This exam was performed according to the departmental dose-optimization program which includes automated exposure control, adjustment of the mA and/or kV according to patient size and/or use of iterative reconstruction technique. CONTRAST:  OMNIPAQUE  IOHEXOL  350 MG/ML SOLN COMPARISON:  None Available. FINDINGS: CTA CHEST FINDINGS Cardiovascular: No evidence of embolism to the proximal subsegmental pulmonary artery level. Normal cardiac size. No pericardial effusion. There is dilated ascending aorta measuring up to 4.2 cm on this exam. Please note this examination was not performed without cardiac gating and therefore the measurement is subject to motion artifact. There are coronary artery calcifications, in keeping with coronary artery disease. There are also mild peripheral atherosclerotic vascular calcifications of thoracic aorta and its major branches. Mediastinum/Nodes: Visualized thyroid  gland appears grossly unremarkable. No solid / cystic mediastinal masses. There is small amount of fluid along the ascending aortic arch, likely fluid in the pericardial recess, pericardial cyst or pericardial diverticulum. This is a benign finding. The esophagus is nondistended precluding optimal assessment. No axillary, mediastinal or hilar lymphadenopathy by  size criteria. Lungs/Pleura: The central tracheo-bronchial tree is patent. There is mild, smooth, circumferential thickening of the segmental and subsegmental bronchial walls, throughout bilateral lungs, which is nonspecific. Findings are most commonly seen with bronchitis or reactive airway disease, such as asthma. There is frothy material along the right wall of the lower trachea, favored to represent mucous secretions or aspiration. There are patchy areas of linear, plate-like atelectasis and/or scarring throughout bilateral lungs. No mass or consolidation. No pleural effusion or pneumothorax. There is a 3 x 4 mm noncalcified opacity in the lingular segment of  left upper lobe (series 13, image 61), favored to represent atelectasis/scarring. No suspicious lung nodule. Musculoskeletal: The visualized soft tissues of the chest wall are grossly unremarkable. No suspicious osseous lesions. There are mild multilevel degenerative changes in the visualized spine. Review of the MIP images confirms the above findings. CT ABDOMEN and PELVIS FINDINGS Hepatobiliary: The liver is normal in size. Non-cirrhotic configuration. No suspicious mass. These is mild diffuse hepatic steatosis. No intrahepatic or extrahepatic bile duct dilation. No calcified gallstones. Normal gallbladder wall thickness. No pericholecystic inflammatory changes. Pancreas: Unremarkable. No pancreatic ductal dilatation or surrounding inflammatory changes. Spleen: Within normal limits. No focal lesion. Adrenals/Urinary Tract: Adrenal glands are unremarkable. No suspicious renal mass. There are partially exophytic single simple cyst in the right kidney measuring up to 2.4 x 2.8 cm. There is a nearly completely exophytic 2.4 x 3.1 cm cyst arising from the left kidney lower pole, posterolaterally. No nephroureterolithiasis or obstructive uropathy. Unremarkable urinary bladder. Stomach/Bowel: No disproportionate dilation of the small or large bowel loops. No  evidence of abnormal bowel wall thickening or inflammatory changes. The appendix is unremarkable. Vascular/Lymphatic: No ascites or pneumoperitoneum. No abdominal or pelvic lymphadenopathy, by size criteria. No aneurysmal dilation of the major abdominal arteries. There are moderate peripheral atherosclerotic vascular calcifications of the aorta and its major branches. Reproductive: Normal size prostate. Symmetric seminal vesicles. Other: There is a tiny fat containing umbilical hernia. The soft tissues and abdominal wall are otherwise unremarkable. Musculoskeletal: No suspicious osseous lesions. There are mild multilevel degenerative changes in the visualized spine. Review of the MIP images confirms the above findings. IMPRESSION: 1. No embolism to the proximal subsegmental pulmonary artery level. 2. No acute inflammatory process identified within the abdomen or pelvis. 3. There is a 3 x 4 mm noncalcified opacity in the lingular segment of left upper lobe, favored to represent atelectasis/scarring. 4. Ascending aorta measures up 4.2 cm on this non cardiac gated exam. 5. Multiple other nonacute observations, as described above. Aortic Atherosclerosis (ICD10-I70.0). Electronically Signed   By: Ree Molt M.D.   On: 01/04/2024 16:51   CT Angio Chest PE W and/or Wo Contrast Result Date: 01/04/2024 CLINICAL DATA:  Pulmonary embolism (PE) suspected, high prob; Abdominal pain, acute, nonlocalized. Nausea/vomiting and diarrhea. EXAM: CT ANGIOGRAPHY CHEST CT ABDOMEN AND PELVIS WITH CONTRAST TECHNIQUE: Multidetector CT imaging of the chest was performed using the standard protocol during bolus administration of intravenous contrast. Multiplanar CT image reconstructions and MIPs were obtained to evaluate the vascular anatomy. Multidetector CT imaging of the abdomen and pelvis was performed using the standard protocol during bolus administration of intravenous contrast. RADIATION DOSE REDUCTION: This exam was performed  according to the departmental dose-optimization program which includes automated exposure control, adjustment of the mA and/or kV according to patient size and/or use of iterative reconstruction technique. CONTRAST:  OMNIPAQUE  IOHEXOL  350 MG/ML SOLN COMPARISON:  None Available. FINDINGS: CTA CHEST FINDINGS Cardiovascular: No evidence of embolism to the proximal subsegmental pulmonary artery level. Normal cardiac size. No pericardial effusion. There is dilated ascending aorta measuring up to 4.2 cm on this exam. Please note this examination was not performed without cardiac gating and therefore the measurement is subject to motion artifact. There are coronary artery calcifications, in keeping with coronary artery disease. There are also mild peripheral atherosclerotic vascular calcifications of thoracic aorta and its major branches. Mediastinum/Nodes: Visualized thyroid  gland appears grossly unremarkable. No solid / cystic mediastinal masses. There is small amount of fluid along the ascending aortic arch, likely fluid in  the pericardial recess, pericardial cyst or pericardial diverticulum. This is a benign finding. The esophagus is nondistended precluding optimal assessment. No axillary, mediastinal or hilar lymphadenopathy by size criteria. Lungs/Pleura: The central tracheo-bronchial tree is patent. There is mild, smooth, circumferential thickening of the segmental and subsegmental bronchial walls, throughout bilateral lungs, which is nonspecific. Findings are most commonly seen with bronchitis or reactive airway disease, such as asthma. There is frothy material along the right wall of the lower trachea, favored to represent mucous secretions or aspiration. There are patchy areas of linear, plate-like atelectasis and/or scarring throughout bilateral lungs. No mass or consolidation. No pleural effusion or pneumothorax. There is a 3 x 4 mm noncalcified opacity in the lingular segment of left upper lobe (series  13, image 61), favored to represent atelectasis/scarring. No suspicious lung nodule. Musculoskeletal: The visualized soft tissues of the chest wall are grossly unremarkable. No suspicious osseous lesions. There are mild multilevel degenerative changes in the visualized spine. Review of the MIP images confirms the above findings. CT ABDOMEN and PELVIS FINDINGS Hepatobiliary: The liver is normal in size. Non-cirrhotic configuration. No suspicious mass. These is mild diffuse hepatic steatosis. No intrahepatic or extrahepatic bile duct dilation. No calcified gallstones. Normal gallbladder wall thickness. No pericholecystic inflammatory changes. Pancreas: Unremarkable. No pancreatic ductal dilatation or surrounding inflammatory changes. Spleen: Within normal limits. No focal lesion. Adrenals/Urinary Tract: Adrenal glands are unremarkable. No suspicious renal mass. There are partially exophytic single simple cyst in the right kidney measuring up to 2.4 x 2.8 cm. There is a nearly completely exophytic 2.4 x 3.1 cm cyst arising from the left kidney lower pole, posterolaterally. No nephroureterolithiasis or obstructive uropathy. Unremarkable urinary bladder. Stomach/Bowel: No disproportionate dilation of the small or large bowel loops. No evidence of abnormal bowel wall thickening or inflammatory changes. The appendix is unremarkable. Vascular/Lymphatic: No ascites or pneumoperitoneum. No abdominal or pelvic lymphadenopathy, by size criteria. No aneurysmal dilation of the major abdominal arteries. There are moderate peripheral atherosclerotic vascular calcifications of the aorta and its major branches. Reproductive: Normal size prostate. Symmetric seminal vesicles. Other: There is a tiny fat containing umbilical hernia. The soft tissues and abdominal wall are otherwise unremarkable. Musculoskeletal: No suspicious osseous lesions. There are mild multilevel degenerative changes in the visualized spine. Review of the MIP images  confirms the above findings. IMPRESSION: 1. No embolism to the proximal subsegmental pulmonary artery level. 2. No acute inflammatory process identified within the abdomen or pelvis. 3. There is a 3 x 4 mm noncalcified opacity in the lingular segment of left upper lobe, favored to represent atelectasis/scarring. 4. Ascending aorta measures up 4.2 cm on this non cardiac gated exam. 5. Multiple other nonacute observations, as described above. Aortic Atherosclerosis (ICD10-I70.0). Electronically Signed   By: Ree Molt M.D.   On: 01/04/2024 16:51   DG Chest Portable 1 View Result Date: 01/04/2024 CLINICAL DATA:  Shortness of breath. EXAM: PORTABLE CHEST 1 VIEW COMPARISON:  None Available. FINDINGS: Low lung volume. Moderately elevated left hemidiaphragm. There is blunting of left lateral costophrenic angle, which may suggest trace left pleural effusion. Right lateral costophrenic angle is clear. There are probable atelectatic changes at the left lung base. Bilateral lung fields are otherwise clear. Moderately enlarged cardio-mediastinal silhouette, which is likely accentuated by low lung volume and AP technique. No acute osseous abnormalities. The soft tissues are within normal limits. IMPRESSION: Low lung volume. Probable trace left pleural effusion with left basilar atelectasis. Electronically Signed   By: Ree Molt HERO.D.  On: 01/04/2024 14:36   (Echo, Carotid, EGD, Colonoscopy, ERCP)    Subjective: No complaints  Discharge Exam: Vitals:   01/06/24 2034 01/07/24 0429  BP: 137/87 96/67  Pulse:  60  Resp: 20 16  Temp: (!) 97.4 F (36.3 C) 97.8 F (36.6 C)  SpO2: 96% 92%   Vitals:   01/06/24 1334 01/06/24 1549 01/06/24 2034 01/07/24 0429  BP: 90/74 104/63 137/87 96/67  Pulse: (!) 120 73  60  Resp: 20 20 20 16   Temp:  97.7 F (36.5 C) (!) 97.4 F (36.3 C) 97.8 F (36.6 C)  TempSrc:  Oral Oral Oral  SpO2: 96% 93% 96% 92%  Weight:      Height:        General: Pt is alert,  awake, not in acute distress Cardiovascular: RRR, S1/S2 +, no rubs, no gallops Respiratory: CTA bilaterally, no wheezing, no rhonchi Abdominal: Soft, NT, ND, bowel sounds + Extremities: no edema, no cyanosis    The results of significant diagnostics from this hospitalization (including imaging, microbiology, ancillary and laboratory) are listed below for reference.     Microbiology: Recent Results (from the past 240 hours)  Blood culture (routine x 2)     Status: None (Preliminary result)   Collection Time: 01/04/24  2:16 PM   Specimen: BLOOD  Result Value Ref Range Status   Specimen Description   Final    BLOOD RIGHT ANTECUBITAL Performed at [facility], [address] [place], [place] [address]    Special Requests   Final    BOTTLES DRAWN AEROBIC AND ANAEROBIC Blood Culture adequate volume Performed at [facility], [address] [place], [place] [address]    Culture   Final    NO GROWTH 3 DAYS Performed at [facility], [address] [place], [place] [address]    Report Status PENDING  Incomplete  Blood culture (routine x 2)     Status: None (Preliminary result)   Collection Time: 01/04/24  2:21 PM   Specimen: BLOOD  Result Value Ref Range Status   Specimen Description   Final    BLOOD LEFT ANTECUBITAL Performed at [facility], [address] [place], [place] [address]    Special Requests   Final    BOTTLES DRAWN AEROBIC AND ANAEROBIC Blood Culture adequate volume Performed at [facility], [address] [place], [place] [address]    Culture   Final    NO GROWTH 3 DAYS Performed at [facility], [address] [place], [place] [address]    Report Status PENDING  Incomplete     Labs: BNP (last 3 results) No results for input(s): BNP in the last 8760 hours. Basic Metabolic Panel: Recent Labs  Lab 01/04/24 1440 01/05/24 0001 01/05/24 0107 01/06/24 1023 01/07/24 0324  NA 140 138  --  136  136  K 4.4 4.7  --  3.9 3.7  CL 101 106  --  103 103  CO2 23 25  --  23 23  GLUCOSE 106* 119*  --  125* 117*  BUN 16 16  --  16 18  CREATININE 1.36* 1.33*  --  1.30* 1.13  CALCIUM  10.0 9.1  --  9.1 8.7*  MG  --   --  1.4* 1.5* 1.9   Liver Function Tests: Recent Labs  Lab 01/04/24 1440 01/05/24 0001  AST 35 24  ALT 36 29  ALKPHOS 54 42  BILITOT 0.4 0.8  PROT 7.4 6.1*  ALBUMIN 4.4 3.4*   Recent Labs  Lab 01/04/24 1440  LIPASE 55*   No results for input(s): AMMONIA in the last 168 hours. CBC: Recent Labs  Lab 01/04/24 1440 01/05/24 0001 01/06/24 1023 01/07/24 0324  WBC 5.5 6.0 6.5 5.3  NEUTROABS  --  2.7  --   --   HGB 13.7 12.5* 11.4* 10.5*  HCT 39.3 36.9* 32.8* 29.8*  MCV 93.8 95.6 94.5 94.0  PLT 232 221 175 150   Cardiac Enzymes: No results for input(s): CKTOTAL, CKMB, CKMBINDEX, TROPONINI in the last 168 hours. BNP: Invalid input(s): POCBNP CBG: Recent Labs  Lab 01/06/24 0601 01/06/24 1318 01/06/24 1639 01/06/24 2105 01/07/24 0604  GLUCAP 135* 142* 100* 273* 121*   D-Dimer No results for input(s): DDIMER in the last 72 hours. Hgb A1c Recent Labs    01/05/24 0001  HGBA1C 6.1*   Lipid Profile Recent Labs    01/06/24 0321  CHOL 145  HDL 47  LDLCALC 82  TRIG 79  CHOLHDL 3.1   Thyroid  function studies Recent Labs    01/05/24 0001  TSH 4.487   Anemia work up No results for input(s): VITAMINB12, FOLATE, FERRITIN, TIBC, IRON, RETICCTPCT in the last 72 hours. Urinalysis    Component Value Date/Time   COLORURINE YELLOW 01/05/2024 1222   APPEARANCEUR CLEAR 01/05/2024 1222   LABSPEC >1.046 (H) 01/05/2024 1222   PHURINE 5.0 01/05/2024 1222   GLUCOSEU 50 (A) 01/05/2024 1222   HGBUR NEGATIVE 01/05/2024 1222   BILIRUBINUR NEGATIVE 01/05/2024 1222   BILIRUBINUR negative 01/09/2017 1018   BILIRUBINUR small 08/19/2013 1323   KETONESUR NEGATIVE 01/05/2024 1222   PROTEINUR NEGATIVE 01/05/2024 1222   UROBILINOGEN 0.2  01/09/2017 1018   NITRITE NEGATIVE 01/05/2024 1222   LEUKOCYTESUR NEGATIVE 01/05/2024 1222   Sepsis Labs Recent Labs  Lab 01/04/24 1440 01/05/24 0001 01/06/24 1023 01/07/24 0324  WBC 5.5 6.0 6.5 5.3   Microbiology Recent Results (from the past 240 hours)  Blood culture (routine x 2)     Status: None (Preliminary result)   Collection Time: 01/04/24  2:16 PM   Specimen: BLOOD  Result Value Ref Range Status   Specimen Description   Final    BLOOD RIGHT ANTECUBITAL Performed at [facility], [address] [place], [place] [address]    Special Requests   Final    BOTTLES DRAWN AEROBIC AND ANAEROBIC Blood Culture adequate volume Performed at [facility], [address] [place], [place] [address]    Culture   Final    NO GROWTH 3 DAYS Performed at [facility], [address], [place], [place] [address]    Report Status PENDING  Incomplete  Blood culture (routine x 2)     Status: None (Preliminary result)   Collection Time: 01/04/24  2:21 PM   Specimen: BLOOD  Result Value Ref Range Status   Specimen Description   Final    BLOOD LEFT ANTECUBITAL Performed at [facility], [address] [place], [place] [address]    Special Requests   Final    BOTTLES DRAWN AEROBIC AND ANAEROBIC Blood Culture adequate volume Performed at [facility], [address] [place], [place] [address]    Culture   Final    NO GROWTH 3 DAYS Performed at [facility], [address], [place], [place] [address]    Report Status PENDING  Incomplete  Time coordinating discharge: Over 35 minutes  SIGNED:   Erle Odell Castor, MD  [facility] 01/07/2024, 9:26 AM Pager   If 7PM-7AM, please contact night-coverage [email or web address] Password [email or web address]

## 2024-01-07 NOTE — TOC Transition Note (Signed)
 Transition of Care [facility]) - Discharge Note   Patient Details  Name: Darren Gutierrez MRN: [id number] Date of Birth: [birth year]  Transition of Care [facility]) CM/SW Contact:  Rosalva Jon Bloch, RN Phone Number: 01/07/2024, 10:22 AM   Clinical Narrative:    Patient will DC to: Home Anticipated DC date: 01/07/2024 Family notified: yes Transport by: car     AKI/  Atrial fibrillation with RVR  Per MD patient ready for DC today . RN, patient, and patient's daughter aware of DC plan.  Pt without home health or DME needs. Post hospital f/u noted on AVS.  Daughter to provide d/c to home. Pt without RX med concerns. Rx meds filled by [facility] pharmacy and will be picked up for pt prior to d/c .  RNCM will sign off for now as intervention is no longer needed. Please consult us  again if new needs arise.    Final next level of care: Home/Self Care Barriers to Discharge: No Barriers Identified   Patient Goals and CMS Choice            Discharge Placement                       Discharge Plan and Services Additional resources added to the After Visit Summary for                                       Social Drivers of Health (SDOH) Interventions SDOH Screenings   Food Insecurity: Patient Declined (01/05/2024)  Housing: Unknown (01/05/2024)  Transportation Needs: Patient Declined (01/05/2024)  Utilities: Patient Declined (01/05/2024)  Social Connections: Patient Declined (01/05/2024)  Tobacco Use: Medium Risk (01/04/2024)     Readmission Risk Interventions     No data to display

## 2024-01-09 LAB — CULTURE, BLOOD (ROUTINE X 2)
Culture: NO GROWTH
Culture: NO GROWTH
Special Requests: ADEQUATE
Special Requests: ADEQUATE

## 2024-01-27 ENCOUNTER — Encounter (HOSPITAL_COMMUNITY): Payer: Self-pay | Admitting: Physician Assistant

## 2024-01-27 ENCOUNTER — Ambulatory Visit (HOSPITAL_COMMUNITY)
Admission: RE | Admit: 2024-01-27 | Discharge: 2024-01-27 | Disposition: A | Discharge status: Home / Self Care | Source: Ambulatory Visit | Attending: Physician Assistant | Admitting: Physician Assistant

## 2024-01-27 VITALS — BP 100/70 | HR 126 | Ht 72.0 in | Wt 213.2 lb

## 2024-01-27 DIAGNOSIS — I4819 Other persistent atrial fibrillation: Secondary | ICD-10-CM | POA: Insufficient documentation

## 2024-01-27 DIAGNOSIS — I484 Atypical atrial flutter: Secondary | ICD-10-CM | POA: Insufficient documentation

## 2024-01-27 DIAGNOSIS — I4891 Unspecified atrial fibrillation: Secondary | ICD-10-CM | POA: Diagnosis not present

## 2024-01-27 DIAGNOSIS — D6869 Other thrombophilia: Secondary | ICD-10-CM | POA: Insufficient documentation

## 2024-01-27 MED ORDER — RIVAROXABAN 20 MG PO TABS: 20.0000 mg | ORAL_TABLET | Freq: Every day | ORAL | 3 refills | Status: DC

## 2024-01-27 MED ORDER — DILTIAZEM HCL ER COATED BEADS 360 MG PO CP24: 360.0000 mg | ORAL_CAPSULE | Freq: Every day | ORAL | 3 refills | Status: DC

## 2024-01-27 NOTE — Patient Instructions (Signed)
 Cardioversion scheduled for: Tuesday, September 23rd   - Arrive at the [facility] ([address])  and check in with ADMITTING at 9:30 am   - Do not eat or drink anything after midnight the night prior to your procedure.   - Take all your morning medication (except diabetic medications) with a sip of water prior to arrival.  - Do NOT miss any doses of your blood thinner - if you should miss a dose or take a dose more than 4 hours late -- please notify our office immediately.  - You will not be able to drive home after your procedure. Please ensure you have a responsible adult to drive you home. You will need someone with you for 24 hours post procedure.     - Expect to be in the procedural area approximately 2 hours.   - If you feel as if you go back into normal rhythm prior to scheduled cardioversion, please notify our office immediately.   If your procedure is canceled in the cardioversion suite you will be charged a cancellation fee.

## 2024-01-27 NOTE — Progress Notes (Signed)
 Primary Care Physician: Patient, No Pcp Per Primary Cardiologist: Stanly DELENA Leavens, MD Electrophysiologist: None  Referring Physician: Dr Leavens Hammond Monier is a 73 y.o. male with a history of HTN, HLD, DM, CAD, alcohol abuse, atrial fibrillation who presents for follow up in the [facility] Atrial Fibrillation [facility]. Patient presented to the emergency department on 01/04/2024 complaining of epigastric pain and was found to be in A-fib with RVR.  Was initially started on IV Cardizem  and later transition to oral Cardizem . Patient is on Xarelto  for stroke prevention.    Patient presents today for follow up for atrial fibrillation. His daughter serves as an Engineer, technical sales today. He appears to be in atrial flutter today with elevated rates. He continues to feel very fatigued. This had been ongoing for several weeks prior to his ED visit. No bleeding issues   Today, he denies symptoms of palpitations, chest pain, orthopnea, PND, lower extremity edema, dizziness, presyncope, syncope, snoring, daytime somnolence, bleeding, or neurologic sequela. The patient is tolerating medications without difficulties and is otherwise without complaint today.    Atrial Fibrillation Risk Factors:  he does not have symptoms or diagnosis of sleep apnea. he does not have a history of rheumatic fever. he does have a history of alcohol use. The patient does not have a history of early familial atrial fibrillation or other arrhythmias.  Atrial Fibrillation Management history:  Previous antiarrhythmic drugs: none Previous cardioversions: none Previous ablations: none Anticoagulation history: Xarelto   ROS- All systems are reviewed and negative except as per the HPI above.  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    Current Outpatient Medications  Medication Sig Dispense Refill   atorvastatin  (LIPITOR) 40 MG tablet TAKE 1 TABLET BY MOUTH ONCE DAILY 30 tablet 0   CHOLECALCIFEROL PO Take 1 tablet by mouth daily. Dosage unknown     glipiZIDE (GLUCOTROL XL) 2.5 MG 24 hr tablet Take 2.5 mg by mouth daily with breakfast.     lisinopril -hydrochlorothiazide  (PRINZIDE ,ZESTORETIC ) 20-25 MG tablet Take 1 tablet by mouth daily. 90 tablet 3   metFORMIN  (GLUCOPHAGE ) 1000 MG tablet Take 1,000 mg by mouth 2 (two) times daily with a meal.     pantoprazole  (PROTONIX ) 40 MG tablet Take 1 tablet (40 mg total) by mouth daily. 30 tablet 1   tamsulosin  (FLOMAX ) 0.4 MG CAPS capsule Take 1 capsule (0.4 mg total) by mouth daily after breakfast. 30 capsule 1   diltiazem  (CARDIZEM  CD) 360 MG 24 hr capsule Take 1 capsule (360 mg total) by mouth daily. 30 capsule 3   rivaroxaban  (XARELTO ) 20 MG TABS tablet Take 1 tablet (20 mg total) by mouth daily with supper. 30 tablet 3   No current facility-administered medications for this encounter.    Physical Exam: BP 100/70   Pulse (!) 126   Ht 6' (1.829 m)   Wt 96.7 kg   BMI 28.92 kg/m   GEN: Well nourished, well developed in no acute distress CARDIAC: Regular rate and rhythm, tachycardia, no murmurs, rubs, gallops RESPIRATORY:  Clear to auscultation without rales, wheezing or rhonchi  ABDOMEN: Soft, non-tender, non-distended EXTREMITIES:  No edema; No deformity   Wt Readings from Last 3 Encounters:  01/27/24 96.7 kg  01/04/24 94 kg  02/08/17 107.2 kg  EKG today demonstrates  Atrial flutter with 2:1 block Vent. rate 126 BPM PR interval 176 ms QRS duration 104 ms QT/QTcB 322/466 ms   Echo 01/05/24 demonstrated   1. Left ventricular ejection fraction, by estimation, is 60 to 65%. The  left ventricle has normal function. The left ventricle has no regional  wall motion abnormalities. There is mild concentric left  ventricular  hypertrophy. Left ventricular diastolic function could not be evaluated.   2. Right ventricular systolic function is normal. The right ventricular  size is normal.   3. Left atrial size was mildly dilated.   4. The mitral valve is normal in structure. No evidence of mitral valve  regurgitation. No evidence of mitral stenosis.   5. The aortic valve has an indeterminant number of cusps. Aortic valve  regurgitation is not visualized. No aortic stenosis is present.   6. Aortic dilatation noted. There is dilatation of the aortic root,  measuring 42 mm.   7. The inferior vena cava is dilated in size with <50% respiratory  variability, suggesting right atrial pressure of 15 mmHg.    CHA2DS2-VASc Score = 4  The patient's score is based upon: CHF History: 0 HTN History: 1 Diabetes History: 1 Stroke History: 0 Vascular Disease History: 1 Age Score: 1 Gender Score: 0       ASSESSMENT AND PLAN: Persistent Atrial Fibrillation/atrial flutter (ICD10:  I48.19) The patient's CHA2DS2-VASc score is 4, indicating a 4.8% annual risk of stroke.   General education about afib provided and questions answered. We also discussed his stroke risk and the risks and benefits of anticoagulation. Will plan for DCCV now that he has been on anticoagulation at least 3 weeks.  Check bmet/cbc Continue diltiazem  360 mg daily Continue Xarelto  20 mg daily  Secondary Hypercoagulable State (ICD10:  D68.69) The patient is at significant risk for stroke/thromboembolism based upon his CHA2DS2-VASc Score of 4.  Continue Rivaroxaban  (Xarelto ). No bleeding issues.   HTN Stable on current regimen  CAD/aortic atherosclerosis Noted on CT No anginal symptoms    Follow up in the AF clinic post DCCV.    Informed Consent   Shared Decision Making/Informed Consent The risks (stroke, cardiac arrhythmias rarely resulting in the need for a temporary or permanent pacemaker, skin irritation or burns and  complications associated with conscious sedation including aspiration, arrhythmia, respiratory failure and death), benefits (restoration of normal sinus rhythm) and alternatives of a direct current cardioversion were explained in detail to Mr. Mergenthaler and he agrees to proceed.       [facility] [address] [place], [place] [address] [phone]

## 2024-01-28 ENCOUNTER — Ambulatory Visit (HOSPITAL_COMMUNITY): Payer: Self-pay | Admitting: Physician Assistant

## 2024-01-28 LAB — BASIC METABOLIC PANEL WITH GFR
BUN/Creatinine Ratio: 13 (ref 10–24)
BUN: 21 mg/dL (ref 8–27)
CO2: 22 mmol/L (ref 20–29)
Calcium: 9.1 mg/dL (ref 8.6–10.2)
Chloride: 103 mmol/L (ref 96–106)
Creatinine, Ser: 1.6 mg/dL — ABNORMAL HIGH (ref 0.76–1.27)
Glucose: 165 mg/dL — ABNORMAL HIGH (ref 70–99)
Potassium: 4 mmol/L (ref 3.5–5.2)
Sodium: 141 mmol/L (ref 134–144)
eGFR: 46 mL/min/1.73 — ABNORMAL LOW (ref >59)

## 2024-01-28 LAB — CBC
Hematocrit: 38.3 % (ref 37.5–51.0)
Hemoglobin: 12.7 g/dL — ABNORMAL LOW (ref 13.0–17.7)
MCH: 31.4 pg (ref 26.6–33.0)
MCHC: 33.2 g/dL (ref 31.5–35.7)
MCV: 95 fL (ref 79–97)
Platelets: 237 x10E3/uL (ref 150–450)
RBC: 4.04 x10E6/uL — ABNORMAL LOW (ref 4.14–5.80)
RDW: 12.2 % (ref 11.6–15.4)
WBC: 5.4 x10E3/uL (ref 3.4–10.8)

## 2024-01-31 NOTE — Progress Notes (Signed)
 Spoke to patient's daughter, Natasa and instructed them to come at 0930  and to be NPO after 0000.     Confirmed that patient will have a ride home and someone to stay with them for 24 hours after the procedure.   Medications reviewed.  Confirmed blood thinner.  Confirmed no breaks in taking blood thinner for 3+ weeks prior to procedure.

## 2024-02-01 ENCOUNTER — Ambulatory Visit (HOSPITAL_COMMUNITY): Admitting: Anesthesiology

## 2024-02-01 ENCOUNTER — Ambulatory Visit (HOSPITAL_COMMUNITY)
Admission: RE | Admit: 2024-02-01 | Discharge: 2024-02-01 | Disposition: A | Discharge status: Home / Self Care | Attending: Cardiology | Admitting: Cardiology

## 2024-02-01 ENCOUNTER — Encounter (HOSPITAL_COMMUNITY)
Admission: RE | Disposition: A | Discharge status: Home / Self Care | Payer: Self-pay | Source: Home / Self Care | Attending: Cardiology

## 2024-02-01 ENCOUNTER — Other Ambulatory Visit (HOSPITAL_COMMUNITY): Payer: Self-pay

## 2024-02-01 ENCOUNTER — Encounter (HOSPITAL_COMMUNITY): Payer: Self-pay | Admitting: Cardiology

## 2024-02-01 ENCOUNTER — Other Ambulatory Visit: Payer: Self-pay

## 2024-02-01 DIAGNOSIS — D6869 Other thrombophilia: Secondary | ICD-10-CM | POA: Diagnosis not present

## 2024-02-01 DIAGNOSIS — I4892 Unspecified atrial flutter: Secondary | ICD-10-CM | POA: Insufficient documentation

## 2024-02-01 DIAGNOSIS — I10 Essential (primary) hypertension: Secondary | ICD-10-CM | POA: Diagnosis not present

## 2024-02-01 DIAGNOSIS — I4819 Other persistent atrial fibrillation: Secondary | ICD-10-CM | POA: Insufficient documentation

## 2024-02-01 DIAGNOSIS — Z87891 Personal history of nicotine dependence: Secondary | ICD-10-CM | POA: Diagnosis not present

## 2024-02-01 DIAGNOSIS — Z79899 Other long term (current) drug therapy: Secondary | ICD-10-CM | POA: Insufficient documentation

## 2024-02-01 DIAGNOSIS — E785 Hyperlipidemia, unspecified: Secondary | ICD-10-CM | POA: Insufficient documentation

## 2024-02-01 DIAGNOSIS — Z7901 Long term (current) use of anticoagulants: Secondary | ICD-10-CM | POA: Diagnosis not present

## 2024-02-01 DIAGNOSIS — I2510 Atherosclerotic heart disease of native coronary artery without angina pectoris: Secondary | ICD-10-CM | POA: Diagnosis not present

## 2024-02-01 DIAGNOSIS — I700 Atherosclerosis of aorta: Secondary | ICD-10-CM | POA: Diagnosis not present

## 2024-02-01 DIAGNOSIS — I483 Typical atrial flutter: Secondary | ICD-10-CM | POA: Diagnosis not present

## 2024-02-01 DIAGNOSIS — E119 Type 2 diabetes mellitus without complications: Secondary | ICD-10-CM | POA: Diagnosis not present

## 2024-02-01 DIAGNOSIS — K219 Gastro-esophageal reflux disease without esophagitis: Secondary | ICD-10-CM | POA: Insufficient documentation

## 2024-02-01 DIAGNOSIS — Z7984 Long term (current) use of oral hypoglycemic drugs: Secondary | ICD-10-CM | POA: Diagnosis not present

## 2024-02-01 HISTORY — PX: CARDIOVERSION: EP1203

## 2024-02-01 SURGERY — CARDIOVERSION (CATH LAB)
Anesthesia: General

## 2024-02-01 MED ORDER — SODIUM CHLORIDE 0.9 % IV SOLN: INTRAVENOUS | Status: DC | PRN

## 2024-02-01 MED ORDER — SODIUM CHLORIDE 0.9 % IV SOLN: INTRAVENOUS | Status: DC

## 2024-02-01 MED ORDER — LIDOCAINE 2% (20 MG/ML) 5 ML SYRINGE
INTRAMUSCULAR | Status: DC | PRN
  Administered 2024-02-01: 60 mg via INTRAVENOUS

## 2024-02-01 MED ORDER — SODIUM CHLORIDE 0.9% FLUSH
INTRAVENOUS | Status: DC | PRN
  Administered 2024-02-01 (×5): 5 mL via INTRAVENOUS

## 2024-02-01 MED ORDER — PROPOFOL 10 MG/ML IV BOLUS
INTRAVENOUS | Status: DC | PRN
  Administered 2024-02-01: 70 mg via INTRAVENOUS
  Administered 2024-02-01: 20 mg via INTRAVENOUS

## 2024-02-01 MED ORDER — EPHEDRINE SULFATE (PRESSORS) 50 MG/ML IJ SOLN
INTRAMUSCULAR | Status: DC | PRN
  Administered 2024-02-01: 10 mg via INTRAVENOUS
  Administered 2024-02-01: 15 mg via INTRAVENOUS

## 2024-02-01 MED ORDER — PHENYLEPHRINE HCL (PRESSORS) 10 MG/ML IV SOLN
INTRAVENOUS | Status: DC | PRN
Start: 2024-02-01 — End: 2024-02-01
  Administered 2024-02-01 (×2): 160 ug via INTRAVENOUS
  Administered 2024-02-01: 80 ug via INTRAVENOUS

## 2024-02-01 SURGICAL SUPPLY — 1 items: PAD DEFIB RADIO PHYSIO CONN (PAD) ×2 IMPLANT

## 2024-02-01 NOTE — Anesthesia Preprocedure Evaluation (Addendum)
 Anesthesia Evaluation  Patient identified by MRN, date of birth, ID band Patient awake    Reviewed: Allergy & Precautions, NPO status , Patient's Chart, lab work & pertinent test results  History of Anesthesia Complications Negative for: history of anesthetic complications  Airway Mallampati: II  TM Distance: >3 FB Neck ROM: Full    Dental  (+) Upper Dentures, Lower Dentures, Dental Advisory Given   Pulmonary neg shortness of breath, neg sleep apnea, neg COPD, neg recent URI, former smoker   Pulmonary exam normal breath sounds clear to auscultation       Cardiovascular hypertension (lisinopril -HCTZ), Pt. on medications (-) angina (-) Past MI, (-) Cardiac Stents and (-) CABG + dysrhythmias Atrial Fibrillation  Rhythm:Regular Rate:Normal  HLD  TTE 01/05/2024: IMPRESSIONS    1. Left ventricular ejection fraction, by estimation, is 60 to 65%. The  left ventricle has normal function. The left ventricle has no regional  wall motion abnormalities. There is mild concentric left ventricular  hypertrophy. Left ventricular diastolic  function could not be evaluated.   2. Right ventricular systolic function is normal. The right ventricular  size is normal.   3. Left atrial size was mildly dilated.   4. The mitral valve is normal in structure. No evidence of mitral valve  regurgitation. No evidence of mitral stenosis.   5. The aortic valve has an indeterminant number of cusps. Aortic valve  regurgitation is not visualized. No aortic stenosis is present.   6. Aortic dilatation noted. There is dilatation of the aortic root,  measuring 42 mm.   7. The inferior vena cava is dilated in size with <50% respiratory  variability, suggesting right atrial pressure of 15 mmHg.     Neuro/Psych negative neurological ROS     GI/Hepatic Neg liver ROS,GERD  Medicated,,  Endo/Other  diabetes, Type 2, Oral Hypoglycemic Agents    Renal/GU negative  Renal ROS     Musculoskeletal   Abdominal   Peds  Hematology  (+) Blood dyscrasia, anemia Lab Results      Component                Value               Date                      WBC                      5.4                 01/27/2024                HGB                      12.7 (L)            01/27/2024                HCT                      38.3                01/27/2024                MCV                      95  01/27/2024                PLT                      237                 01/27/2024              Anesthesia Other Findings Last Xarelto : last night  Reproductive/Obstetrics                              Anesthesia Physical Anesthesia Plan  ASA: 3  Anesthesia Plan: General   Post-op Pain Management: Minimal or no pain anticipated   Induction: Intravenous  PONV Risk Score and Plan: 2 and Treatment may vary due to age or medical condition  Airway Management Planned: Natural Airway and Nasal Cannula  Additional Equipment:   Intra-op Plan:   Post-operative Plan:   Informed Consent: I have reviewed the patients History and Physical, chart, labs and discussed the procedure including the risks, benefits and alternatives for the proposed anesthesia with the patient or authorized representative who has indicated his/her understanding and acceptance.     Dental advisory given  Plan Discussed with: CRNA and Anesthesiologist  Anesthesia Plan Comments: (Risks of general anesthesia discussed including, but not limited to, sore throat, hoarse voice, chipped/damaged teeth, injury to vocal cords, nausea and vomiting, allergic reactions, lung infection, heart attack, stroke, and death. All questions answered. )         Anesthesia Quick Evaluation

## 2024-02-01 NOTE — Procedures (Signed)
 Electrical Cardioversion Procedure Note [name] [id number] [birth year]  Procedure: Electrical Cardioversion Indications:  Atrial Flutter  Procedure Details Consent: Risks of procedure as well as the alternatives and risks of each were explained to the (patient/caregiver).  Consent for procedure obtained. Time Out: Verified patient identification, verified procedure, site/side was marked, verified correct patient position, special equipment/implants available, medications/allergies/relevent history reviewed, required imaging and test results available.  Performed  Patient placed on cardiac monitor, pulse oximetry, supplemental oxygen as necessary.  Sedation given: Pt sedated by anesthesia with lidocaine  60 mg and diprovan 90 mg IV.  Pacer pads placed anterior and posterior chest.  Cardioverted 1 time(s).  Cardioverted at 200J.  Evaluation Findings: Post procedure EKG shows: NSR Complications: None Patient did tolerate procedure well.   Darren Gutierrez 02/01/2024, 9:33 AM

## 2024-02-01 NOTE — Transfer of Care (Signed)
 Immediate Anesthesia Transfer of Care Note  Patient: Darren Gutierrez  Procedure(s) Performed: CARDIOVERSION  Patient Location: Cath Lab  Anesthesia Type:General  Level of Consciousness: awake, alert , oriented, and patient cooperative  Airway & Oxygen Therapy: Patient Spontanous Breathing and Patient connected to nasal cannula oxygen  Post-op Assessment: Report given to RN and Post -op Vital signs reviewed and stable  Post vital signs: Reviewed and stable  Last Vitals:  Vitals Value Taken Time  BP 84/63  MAP 71 11:24  Temp    Pulse 82   Resp 16   SpO2 100     Last Pain:  Vitals:   02/01/24 0939  TempSrc: Temporal  PainSc: 0-No pain         Complications: No notable events documented.

## 2024-02-01 NOTE — Interval H&P Note (Signed)
 History and Physical Interval Note:  02/01/2024 9:35 AM  Darren Gutierrez  has presented today for surgery, with the diagnosis of AFIB.  The various methods of treatment have been discussed with the patient and family. After consideration of risks, benefits and other options for treatment, the patient has consented to  Procedure(s): CARDIOVERSION (N/A) as a surgical intervention.  The patient's history has been reviewed, patient examined, no change in status, stable for surgery.  I have reviewed the patient's chart and labs.  Questions were answered to the patient's satisfaction.     [name]

## 2024-02-01 NOTE — H&P (Signed)
 ATRIAL FIB OFFICE VISIT 01/27/2024 Atrial Fib Clinic at [facility] Dept of [facility] [address] [place], [place] Cardiology Atypical atrial flutter (HCC) +3 more Dx   Additional Documentation  Vitals: BP 100/70   Pulse 126 Important    Ht 6' (1.829 m)   Wt 96.7 kg   BMI 28.92 kg/m   BSA 2.22 m  Flowsheets: NEWS,   MEWS Score,   Vital Signs,   Anthropometrics,   Method of Visit,   Data  Encounter Info: Billing Info,   History,   Allergies,   Detailed Report   All Notes   Progress Notes by Nellene Quita SAUNDERS, PA at 01/27/2024 1:30 PM  Author: Nellene Quita SAUNDERS, PA Author Type: Physician Assistant Filed: 01/27/2024  2:10 PM  Note Status: Signed Cosign: Cosign Not Required Date of Service: 01/27/2024  1:30 PM  Editor: Nellene Quita SAUNDERS, PA (Physician Assistant)             Expand All Collapse All      Primary Care Physician: Patient, No Pcp Per Primary Cardiologist: Stanly DELENA Leavens, MD Electrophysiologist: None  Referring Physician: Dr Leavens Darren Gutierrez is a 73 y.o. male with a history of HTN, HLD, DM, CAD, alcohol abuse, atrial fibrillation who presents for follow up in the [facility] At[facility] Fibrillation [facility]. Patient presented to the emergency department on 01/04/2024 complaining of epigastric pain and was found to be in A-fib with RVR.  Was initially started on IV Cardizem  and later transition to oral Cardizem . Patient is on Xarelto  for stroke prevention.    Patient presents today for follow up for atrial fibrillation. His daughter serves as an Engineer, technical sales today. He appears to be in atrial flutter today with elevated rates. He continues to feel very fatigued. This had been ongoing for several weeks prior to his ED visit. No bleeding issues    Today, he denies symptoms of palpitations, chest pain, orthopnea, PND, lower extremity edema, dizziness, presyncope, syncope, snoring, daytime somnolence, bleeding, or neurologic sequela.  The patient is tolerating medications without difficulties and is otherwise without complaint today.      Atrial Fibrillation Risk Factors:   he does not have symptoms or diagnosis of sleep apnea. he does not have a history of rheumatic fever. he does have a history of alcohol use. The patient does not have a history of early familial atrial fibrillation or other arrhythmias.   Atrial Fibrillation Management history:   Previous antiarrhythmic drugs: none Previous cardioversions: none Previous ablations: none Anticoagulation history: Xarelto    ROS- All systems are reviewed and negative except as per the HPI above.       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                Current Outpatient Medications  Medication Sig Dispense Refill   atorvastatin  (LIPITOR) 40 MG tablet TAKE 1 TABLET BY MOUTH ONCE DAILY 30 tablet 0   CHOLECALCIFEROL PO Take 1 tablet by mouth daily. Dosage unknown       glipiZIDE (GLUCOTROL XL) 2.5 MG 24 hr tablet Take 2.5 mg by mouth daily with breakfast.       lisinopril -hydrochlorothiazide  (PRINZIDE ,ZESTORETIC ) 20-25 MG tablet  Take 1 tablet by mouth daily. 90 tablet 3   metFORMIN  (GLUCOPHAGE ) 1000 MG tablet Take 1,000 mg by mouth 2 (two) times daily with a meal.       pantoprazole  (PROTONIX ) 40 MG tablet Take 1 tablet (40 mg total) by mouth daily. 30 tablet 1   tamsulosin  (FLOMAX ) 0.4 MG CAPS capsule Take 1 capsule (0.4 mg total) by mouth daily after breakfast. 30 capsule 1   diltiazem  (CARDIZEM  CD) 360 MG 24 hr capsule Take 1 capsule (360 mg total) by mouth daily. 30 capsule 3   rivaroxaban  (XARELTO ) 20 MG TABS tablet Take 1 tablet (20 mg total) by mouth daily with supper. 30 tablet 3      No current facility-administered medications  for this encounter.        Physical Exam: BP 100/70   Pulse (!) 126   Ht 6' (1.829 m)   Wt 96.7 kg   BMI 28.92 kg/m    GEN: Well nourished, well developed in no acute distress CARDIAC: Regular rate and rhythm, tachycardia, no murmurs, rubs, gallops RESPIRATORY:  Clear to auscultation without rales, wheezing or rhonchi  ABDOMEN: Soft, non-tender, non-distended EXTREMITIES:  No edema; No deformity       Wt Readings from Last 3 Encounters:  01/27/24 96.7 kg  01/04/24 94 kg  02/08/17 107.2 kg      EKG today demonstrates  Atrial flutter with 2:1 block Vent. rate 126 BPM PR interval 176 ms QRS duration 104 ms QT/QTcB 322/466 ms     Echo 01/05/24 demonstrated   1. Left ventricular ejection fraction, by estimation, is 60 to 65%. The  left ventricle has normal function. The left ventricle has no regional  wall motion abnormalities. There is mild concentric left ventricular  hypertrophy. Left ventricular diastolic function could not be evaluated.   2. Right ventricular systolic function is normal. The right ventricular  size is normal.   3. Left atrial size was mildly dilated.   4. The mitral valve is normal in structure. No evidence of mitral valve  regurgitation. No evidence of mitral stenosis.   5. The aortic valve has an indeterminant number of cusps. Aortic valve  regurgitation is not visualized. No aortic stenosis is present.   6. Aortic dilatation noted. There is dilatation of the aortic root,  measuring 42 mm.   7. The inferior vena cava is dilated in size with <50% respiratory  variability, suggesting right atrial pressure of 15 mmHg.      CHA2DS2-VASc Score = 4  The patient's score is based upon: CHF History: 0 HTN History: 1 Diabetes History: 1 Stroke History: 0 Vascular Disease History: 1 Age Score: 1 Gender Score: 0         ASSESSMENT AND PLAN: Persistent Atrial Fibrillation/atrial flutter (ICD10:  I48.19) The patient's CHA2DS2-VASc score is 4,  indicating a 4.8% annual risk of stroke.   General education about afib provided and questions answered. We also discussed his stroke risk and the risks and benefits of anticoagulation. Will plan for DCCV now that he has been on anticoagulation at least 3 weeks.  Check bmet/cbc Continue diltiazem  360 mg daily Continue Xarelto  20 mg daily   Secondary Hypercoagulable State (ICD10:  D68.69) The patient is at significant risk for stroke/thromboembolism based upon his CHA2DS2-VASc Score of 4.  Continue Rivaroxaban  (Xarelto ). No bleeding issues.    HTN Stable on current regimen   CAD/aortic atherosclerosis Noted on CT No anginal symptoms       Follow up in  the AF clinic post DCCV.      Informed Consent Shared Decision Making/Informed Consent The risks (stroke, cardiac arrhythmias rarely resulting in the need for a temporary or permanent pacemaker, skin irritation or burns and complications associated with conscious sedation including aspiration, arrhythmia, respiratory failure and death), benefits (restoration of normal sinus rhythm) and alternatives of a direct current cardioversion were explained in detail to Darren Gutierrez and he agrees to proceed.         Darren Gutierrez [facility] [address] [place], [place] [address] [phone]      For DCCV; compliant with xarelto ; no changes. Darren Gutierrez

## 2024-02-01 NOTE — Anesthesia Postprocedure Evaluation (Signed)
 Anesthesia Post Note  Patient: Darren Gutierrez  Procedure(s) Performed: CARDIOVERSION     Patient location during evaluation: PACU Anesthesia Type: General Level of consciousness: awake Pain management: pain level controlled Vital Signs Assessment: post-procedure vital signs reviewed and stable Respiratory status: spontaneous breathing, nonlabored ventilation and respiratory function stable Cardiovascular status: blood pressure returned to baseline and stable Postop Assessment: no apparent nausea or vomiting Anesthetic complications: no   No notable events documented.  Last Vitals:  Vitals:   02/01/24 1155 02/01/24 1200  BP: (!) 87/68 (!) 89/69  Pulse: 74 74  Resp:    Temp:    SpO2: 99% 98%    Last Pain:  Vitals:   02/01/24 0939  TempSrc: Temporal  PainSc: 0-No pain                 Delon Aisha Arch

## 2024-02-02 ENCOUNTER — Encounter (HOSPITAL_COMMUNITY): Payer: Self-pay | Admitting: Cardiology

## 2024-02-23 ENCOUNTER — Ambulatory Visit (HOSPITAL_COMMUNITY)
Admission: RE | Admit: 2024-02-23 | Discharge: 2024-02-23 | Disposition: A | Discharge status: Home / Self Care | Source: Ambulatory Visit | Attending: Physician Assistant | Admitting: Physician Assistant

## 2024-02-23 VITALS — BP 90/60 | HR 96 | Ht 72.0 in | Wt 208.8 lb

## 2024-02-23 DIAGNOSIS — I484 Atypical atrial flutter: Secondary | ICD-10-CM | POA: Insufficient documentation

## 2024-02-23 DIAGNOSIS — D6869 Other thrombophilia: Secondary | ICD-10-CM | POA: Insufficient documentation

## 2024-02-23 DIAGNOSIS — I4891 Unspecified atrial fibrillation: Secondary | ICD-10-CM | POA: Insufficient documentation

## 2024-02-23 DIAGNOSIS — I4819 Other persistent atrial fibrillation: Secondary | ICD-10-CM | POA: Diagnosis present

## 2024-02-23 NOTE — Progress Notes (Signed)
 Primary Care Physician: Patient, No Pcp Per Primary Cardiologist: Stanly DELENA Leavens, MD Electrophysiologist: None  Referring Physician: Dr Leavens Hammond Dipalma is a 73 y.o. male with a history of HTN, HLD, DM, CAD, alcohol abuse, atrial fibrillation who presents for follow up in the [facility]. Patient presented to the emergency department on 01/04/2024 complaining of epigastric pain and was found to be in A-fib with RVR.  Was initially started on IV Cardizem  and later transition to oral Cardizem . Patient is on Xarelto  for stroke prevention.    Patient returns for follow up for atrial fibrillation and atrial flutter. His daughter serves as an interpreter for this visit today. He is s/p DCCV on 02/01/24. He reports that he feels better overall. His daughter notes that he is not as SOB and has more color to his face. No bleeding issues on anticoagulation.   Today, he  denies symptoms of palpitations, chest pain, shortness of breath, orthopnea, PND, lower extremity edema, dizziness, presyncope, syncope, snoring, daytime somnolence, bleeding, or neurologic sequela. The patient is tolerating medications without difficulties and is otherwise without complaint today.    Atrial Fibrillation Risk Factors:  he does not have symptoms or diagnosis of sleep apnea. he does not have a history of rheumatic fever. he does have a history of alcohol use. The patient does not have a history of early familial atrial fibrillation or other arrhythmias.  Atrial Fibrillation Management history:  Previous antiarrhythmic drugs: none Previous cardioversions: 02/01/24 Previous ablations: none Anticoagulation history: Xarelto   ROS- All systems are reviewed and negative except as per the HPI above.  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    Current Outpatient Medications  Medication Sig Dispense Refill   atorvastatin  (LIPITOR) 40 MG tablet TAKE 1 TABLET BY MOUTH ONCE DAILY 30 tablet 0   diltiazem  (CARDIZEM  CD) 360 MG 24 hr capsule Take 1 capsule (360 mg total) by mouth daily. 30 capsule 3   glipiZIDE (GLUCOTROL XL) 2.5 MG 24 hr tablet Take 2.5 mg by mouth daily with breakfast.     lisinopril -hydrochlorothiazide  (PRINZIDE ,ZESTORETIC ) 20-25 MG tablet Take 1 tablet by mouth daily. 90 tablet 3   metFORMIN  (GLUCOPHAGE ) 1000 MG tablet Take 1,000 mg by mouth 2 (two) times daily with a meal.     pantoprazole  (PROTONIX ) 40 MG tablet Take 1 tablet (40 mg total) by mouth daily. 30 tablet 1   rivaroxaban  (XARELTO ) 20 MG TABS tablet Take 1 tablet (20 mg total) by mouth daily with supper. 30 tablet 3   tamsulosin  (FLOMAX ) 0.4 MG CAPS capsule Take 1 capsule (0.4 mg total) by mouth daily after breakfast. 30 capsule 1   No current facility-administered medications for this encounter.    Physical Exam: BP 90/60   Pulse 96   Ht 6' (1.829 m)   Wt 94.7 kg   BMI 28.32 kg/m   GEN: Well nourished, well developed in no acute distress CARDIAC: Regular rate and rhythm, no murmurs, rubs, gallops RESPIRATORY:  Clear to auscultation without rales, wheezing or rhonchi  ABDOMEN: Soft, non-tender, non-distended EXTREMITIES:  No edema; No deformity    Wt Readings from Last 3 Encounters:  02/23/24 94.7 kg  01/27/24 96.7 kg  01/04/24 94 kg     EKG  today demonstrates  SR Vent. rate 96 BPM PR interval 192 ms QRS duration 90 ms QT/QTcB 370/467 ms   Echo 01/05/24 demonstrated   1. Left ventricular ejection fraction, by estimation, is 60 to 65%. The  left ventricle has normal function. The left ventricle has no regional  wall motion abnormalities. There is mild concentric left ventricular  hypertrophy. Left ventricular diastolic function could not be  evaluated.   2. Right ventricular systolic function is normal. The right ventricular  size is normal.   3. Left atrial size was mildly dilated.   4. The mitral valve is normal in structure. No evidence of mitral valve  regurgitation. No evidence of mitral stenosis.   5. The aortic valve has an indeterminant number of cusps. Aortic valve  regurgitation is not visualized. No aortic stenosis is present.   6. Aortic dilatation noted. There is dilatation of the aortic root,  measuring 42 mm.   7. The inferior vena cava is dilated in size with <50% respiratory  variability, suggesting right atrial pressure of 15 mmHg.    CHA2DS2-VASc Score = 4  The patient's score is based upon: CHF History: 0 HTN History: 1 Diabetes History: 1 Stroke History: 0 Vascular Disease History: 1 Age Score: 1 Gender Score: 0       ASSESSMENT AND PLAN: Persistent Atrial Fibrillation/atrial flutter (ICD10:  I48.19) The patient's CHA2DS2-VASc score is 4, indicating a 4.8% annual risk of stroke.   S/p DCCV 02/01/24 Patient appears to be maintaining SR Continue diltiazem  360 mg daily Continue Xarelto  20 mg daily If he has quick return of afib, can consider AAD or ablation. He has done well reducing his alcohol intake.   Secondary Hypercoagulable State (ICD10:  D68.69) The patient is at significant risk for stroke/thromboembolism based upon his CHA2DS2-VASc Score of 4.  Continue Rivaroxaban  (Xarelto ). No bleeding issues.   HTN Borderline low today. He will keep BP log at home. If pressures remain low, can consider decreasing lisinopril -hydrochlorothiazide .  CAD/aortic atherosclerosis Noted on CT No anginal symptoms   Follow up with Dr Santo in 3 months.    [facility] [address] [place], [place] [address] [phone]

## 2024-02-24 ENCOUNTER — Ambulatory Visit (HOSPITAL_COMMUNITY): Payer: Self-pay | Admitting: Physician Assistant

## 2024-02-24 LAB — BASIC METABOLIC PANEL WITH GFR
BUN/Creatinine Ratio: 14 (ref 10–24)
BUN: 20 mg/dL (ref 8–27)
CO2: 19 mmol/L — ABNORMAL LOW (ref 20–29)
Calcium: 9.9 mg/dL (ref 8.6–10.2)
Chloride: 101 mmol/L (ref 96–106)
Creatinine, Ser: 1.4 mg/dL — ABNORMAL HIGH (ref 0.76–1.27)
Glucose: 67 mg/dL — ABNORMAL LOW (ref 70–99)
Potassium: 4.2 mmol/L (ref 3.5–5.2)
Sodium: 140 mmol/L (ref 134–144)
eGFR: 54 mL/min/1.73 — ABNORMAL LOW (ref >59)

## 2024-06-02 ENCOUNTER — Other Ambulatory Visit (HOSPITAL_COMMUNITY): Payer: Self-pay | Admitting: Physician Assistant

## 2024-06-14 ENCOUNTER — Ambulatory Visit: Admitting: Internal Medicine

## 2024-06-30 ENCOUNTER — Ambulatory Visit: Admitting: Internal Medicine
